# Patient Record
Sex: Female | Born: 1960 | Race: Black or African American | Hispanic: No | Smoking: Former smoker
Health system: Southern US, Community
[De-identification: ages and names within clinical notes are randomized; demographics above are authoritative.]

## PROBLEM LIST (undated history)

## (undated) DIAGNOSIS — E119 Type 2 diabetes mellitus without complications: Secondary | ICD-10-CM

## (undated) DIAGNOSIS — J45909 Unspecified asthma, uncomplicated: Secondary | ICD-10-CM

## (undated) DIAGNOSIS — E785 Hyperlipidemia, unspecified: Secondary | ICD-10-CM

## (undated) DIAGNOSIS — J449 Chronic obstructive pulmonary disease, unspecified: Secondary | ICD-10-CM

## (undated) DIAGNOSIS — M1990 Unspecified osteoarthritis, unspecified site: Secondary | ICD-10-CM

## (undated) DIAGNOSIS — G4730 Sleep apnea, unspecified: Secondary | ICD-10-CM

## (undated) DIAGNOSIS — I10 Essential (primary) hypertension: Secondary | ICD-10-CM

## (undated) HISTORY — DX: Chronic obstructive pulmonary disease, unspecified: J44.9

## (undated) HISTORY — DX: Type 2 diabetes mellitus without complications: E11.9

## (undated) HISTORY — DX: Unspecified osteoarthritis, unspecified site: M19.90

## (undated) HISTORY — PX: ANKLE SURGERY: SHX546

## (undated) HISTORY — DX: Unspecified asthma, uncomplicated: J45.909

## (undated) HISTORY — DX: Essential (primary) hypertension: I10

## (undated) HISTORY — DX: Sleep apnea, unspecified: G47.30

## (undated) HISTORY — DX: Hyperlipidemia, unspecified: E78.5

---

## 1982-01-23 HISTORY — PX: TUBAL LIGATION: SHX77

## 2001-01-23 HISTORY — PX: CHOLECYSTECTOMY: SHX55

## 2013-01-29 DIAGNOSIS — IMO0001 Reserved for inherently not codable concepts without codable children: Secondary | ICD-10-CM | POA: Diagnosis not present

## 2013-01-29 DIAGNOSIS — K2270 Barrett's esophagus without dysplasia: Secondary | ICD-10-CM | POA: Diagnosis not present

## 2013-01-29 DIAGNOSIS — H547 Unspecified visual loss: Secondary | ICD-10-CM | POA: Diagnosis not present

## 2013-02-13 DIAGNOSIS — L600 Ingrowing nail: Secondary | ICD-10-CM | POA: Diagnosis not present

## 2013-02-13 DIAGNOSIS — L609 Nail disorder, unspecified: Secondary | ICD-10-CM | POA: Diagnosis not present

## 2013-02-13 DIAGNOSIS — E119 Type 2 diabetes mellitus without complications: Secondary | ICD-10-CM | POA: Diagnosis not present

## 2013-04-01 DIAGNOSIS — I10 Essential (primary) hypertension: Secondary | ICD-10-CM | POA: Diagnosis not present

## 2013-04-01 DIAGNOSIS — IMO0001 Reserved for inherently not codable concepts without codable children: Secondary | ICD-10-CM | POA: Diagnosis not present

## 2013-04-01 DIAGNOSIS — Z1231 Encounter for screening mammogram for malignant neoplasm of breast: Secondary | ICD-10-CM | POA: Diagnosis not present

## 2013-04-01 DIAGNOSIS — F4320 Adjustment disorder, unspecified: Secondary | ICD-10-CM | POA: Diagnosis not present

## 2013-04-01 DIAGNOSIS — J4489 Other specified chronic obstructive pulmonary disease: Secondary | ICD-10-CM | POA: Diagnosis not present

## 2013-04-01 DIAGNOSIS — J449 Chronic obstructive pulmonary disease, unspecified: Secondary | ICD-10-CM | POA: Diagnosis not present

## 2013-04-08 DIAGNOSIS — E119 Type 2 diabetes mellitus without complications: Secondary | ICD-10-CM | POA: Diagnosis not present

## 2013-04-10 DIAGNOSIS — N63 Unspecified lump in unspecified breast: Secondary | ICD-10-CM | POA: Diagnosis not present

## 2013-04-10 DIAGNOSIS — Z1231 Encounter for screening mammogram for malignant neoplasm of breast: Secondary | ICD-10-CM | POA: Diagnosis not present

## 2013-04-15 DIAGNOSIS — S59909A Unspecified injury of unspecified elbow, initial encounter: Secondary | ICD-10-CM | POA: Diagnosis not present

## 2013-04-15 DIAGNOSIS — M79609 Pain in unspecified limb: Secondary | ICD-10-CM | POA: Diagnosis not present

## 2013-04-15 DIAGNOSIS — M7989 Other specified soft tissue disorders: Secondary | ICD-10-CM | POA: Diagnosis not present

## 2013-04-15 DIAGNOSIS — S52509A Unspecified fracture of the lower end of unspecified radius, initial encounter for closed fracture: Secondary | ICD-10-CM | POA: Diagnosis not present

## 2013-04-15 DIAGNOSIS — J449 Chronic obstructive pulmonary disease, unspecified: Secondary | ICD-10-CM | POA: Diagnosis not present

## 2013-04-15 DIAGNOSIS — W108XXA Fall (on) (from) other stairs and steps, initial encounter: Secondary | ICD-10-CM | POA: Diagnosis not present

## 2013-04-15 DIAGNOSIS — J209 Acute bronchitis, unspecified: Secondary | ICD-10-CM | POA: Diagnosis not present

## 2013-04-15 DIAGNOSIS — J4489 Other specified chronic obstructive pulmonary disease: Secondary | ICD-10-CM | POA: Diagnosis not present

## 2013-04-16 DIAGNOSIS — S52539A Colles' fracture of unspecified radius, initial encounter for closed fracture: Secondary | ICD-10-CM | POA: Diagnosis not present

## 2013-04-29 DIAGNOSIS — S52539A Colles' fracture of unspecified radius, initial encounter for closed fracture: Secondary | ICD-10-CM | POA: Diagnosis not present

## 2013-05-22 DIAGNOSIS — L609 Nail disorder, unspecified: Secondary | ICD-10-CM | POA: Diagnosis not present

## 2013-05-22 DIAGNOSIS — L600 Ingrowing nail: Secondary | ICD-10-CM | POA: Diagnosis not present

## 2013-05-22 DIAGNOSIS — E119 Type 2 diabetes mellitus without complications: Secondary | ICD-10-CM | POA: Diagnosis not present

## 2013-05-29 DIAGNOSIS — S5290XD Unspecified fracture of unspecified forearm, subsequent encounter for closed fracture with routine healing: Secondary | ICD-10-CM | POA: Diagnosis not present

## 2013-07-10 DIAGNOSIS — R05 Cough: Secondary | ICD-10-CM | POA: Diagnosis not present

## 2013-07-10 DIAGNOSIS — I10 Essential (primary) hypertension: Secondary | ICD-10-CM | POA: Diagnosis not present

## 2013-07-10 DIAGNOSIS — Z8619 Personal history of other infectious and parasitic diseases: Secondary | ICD-10-CM | POA: Diagnosis not present

## 2013-07-10 DIAGNOSIS — E119 Type 2 diabetes mellitus without complications: Secondary | ICD-10-CM | POA: Diagnosis not present

## 2013-07-10 DIAGNOSIS — Z794 Long term (current) use of insulin: Secondary | ICD-10-CM | POA: Diagnosis not present

## 2013-07-10 DIAGNOSIS — R059 Cough, unspecified: Secondary | ICD-10-CM | POA: Diagnosis not present

## 2013-07-10 DIAGNOSIS — I8500 Esophageal varices without bleeding: Secondary | ICD-10-CM | POA: Diagnosis not present

## 2013-07-10 DIAGNOSIS — R0602 Shortness of breath: Secondary | ICD-10-CM | POA: Diagnosis not present

## 2013-07-10 DIAGNOSIS — R072 Precordial pain: Secondary | ICD-10-CM | POA: Diagnosis not present

## 2013-07-10 DIAGNOSIS — R079 Chest pain, unspecified: Secondary | ICD-10-CM | POA: Diagnosis not present

## 2013-07-10 DIAGNOSIS — R51 Headache: Secondary | ICD-10-CM | POA: Diagnosis not present

## 2013-07-10 DIAGNOSIS — J40 Bronchitis, not specified as acute or chronic: Secondary | ICD-10-CM | POA: Diagnosis not present

## 2013-07-10 DIAGNOSIS — R0789 Other chest pain: Secondary | ICD-10-CM | POA: Diagnosis not present

## 2013-09-04 DIAGNOSIS — B9789 Other viral agents as the cause of diseases classified elsewhere: Secondary | ICD-10-CM | POA: Diagnosis not present

## 2013-09-04 DIAGNOSIS — J4489 Other specified chronic obstructive pulmonary disease: Secondary | ICD-10-CM | POA: Diagnosis not present

## 2013-09-04 DIAGNOSIS — J449 Chronic obstructive pulmonary disease, unspecified: Secondary | ICD-10-CM | POA: Diagnosis not present

## 2013-09-04 DIAGNOSIS — Z87891 Personal history of nicotine dependence: Secondary | ICD-10-CM | POA: Diagnosis not present

## 2013-09-04 DIAGNOSIS — M25569 Pain in unspecified knee: Secondary | ICD-10-CM | POA: Diagnosis not present

## 2013-09-04 DIAGNOSIS — R059 Cough, unspecified: Secondary | ICD-10-CM | POA: Diagnosis not present

## 2013-09-04 DIAGNOSIS — R05 Cough: Secondary | ICD-10-CM | POA: Diagnosis not present

## 2013-09-04 DIAGNOSIS — J029 Acute pharyngitis, unspecified: Secondary | ICD-10-CM | POA: Diagnosis not present

## 2013-10-27 DIAGNOSIS — R079 Chest pain, unspecified: Secondary | ICD-10-CM | POA: Diagnosis not present

## 2013-10-27 DIAGNOSIS — M5489 Other dorsalgia: Secondary | ICD-10-CM | POA: Diagnosis not present

## 2013-10-27 DIAGNOSIS — M549 Dorsalgia, unspecified: Secondary | ICD-10-CM | POA: Diagnosis not present

## 2013-10-27 DIAGNOSIS — K429 Umbilical hernia without obstruction or gangrene: Secondary | ICD-10-CM | POA: Diagnosis not present

## 2013-10-27 DIAGNOSIS — Z8619 Personal history of other infectious and parasitic diseases: Secondary | ICD-10-CM | POA: Diagnosis not present

## 2013-10-27 DIAGNOSIS — R0789 Other chest pain: Secondary | ICD-10-CM | POA: Diagnosis not present

## 2013-10-27 DIAGNOSIS — R109 Unspecified abdominal pain: Secondary | ICD-10-CM | POA: Diagnosis not present

## 2013-10-27 DIAGNOSIS — K420 Umbilical hernia with obstruction, without gangrene: Secondary | ICD-10-CM | POA: Diagnosis not present

## 2013-10-27 DIAGNOSIS — R112 Nausea with vomiting, unspecified: Secondary | ICD-10-CM | POA: Diagnosis not present

## 2013-11-06 DIAGNOSIS — Z23 Encounter for immunization: Secondary | ICD-10-CM | POA: Diagnosis not present

## 2013-12-05 ENCOUNTER — Ambulatory Visit (INDEPENDENT_AMBULATORY_CARE_PROVIDER_SITE_OTHER): Payer: Medicare Other | Admitting: Family Medicine

## 2013-12-05 ENCOUNTER — Encounter: Payer: Self-pay | Admitting: Family Medicine

## 2013-12-05 ENCOUNTER — Emergency Department (HOSPITAL_COMMUNITY)
Admission: EM | Admit: 2013-12-05 | Discharge: 2013-12-05 | Disposition: A | Discharge status: Home / Self Care | Payer: Medicare Other | Attending: Emergency Medicine | Admitting: Emergency Medicine

## 2013-12-05 ENCOUNTER — Encounter (HOSPITAL_COMMUNITY): Payer: Self-pay | Admitting: Emergency Medicine

## 2013-12-05 ENCOUNTER — Other Ambulatory Visit: Payer: Self-pay

## 2013-12-05 ENCOUNTER — Emergency Department (HOSPITAL_COMMUNITY): Payer: Medicare Other

## 2013-12-05 VITALS — BP 179/94 | HR 97 | Temp 98.1°F | Ht 67.5 in | Wt 309.2 lb

## 2013-12-05 DIAGNOSIS — R109 Unspecified abdominal pain: Secondary | ICD-10-CM | POA: Diagnosis present

## 2013-12-05 DIAGNOSIS — R7989 Other specified abnormal findings of blood chemistry: Secondary | ICD-10-CM | POA: Diagnosis not present

## 2013-12-05 DIAGNOSIS — M546 Pain in thoracic spine: Secondary | ICD-10-CM | POA: Diagnosis not present

## 2013-12-05 DIAGNOSIS — Z794 Long term (current) use of insulin: Secondary | ICD-10-CM | POA: Insufficient documentation

## 2013-12-05 DIAGNOSIS — E1169 Type 2 diabetes mellitus with other specified complication: Secondary | ICD-10-CM | POA: Insufficient documentation

## 2013-12-05 DIAGNOSIS — R51 Headache: Secondary | ICD-10-CM | POA: Insufficient documentation

## 2013-12-05 DIAGNOSIS — Z9851 Tubal ligation status: Secondary | ICD-10-CM | POA: Insufficient documentation

## 2013-12-05 DIAGNOSIS — Z7189 Other specified counseling: Secondary | ICD-10-CM

## 2013-12-05 DIAGNOSIS — Z87891 Personal history of nicotine dependence: Secondary | ICD-10-CM | POA: Insufficient documentation

## 2013-12-05 DIAGNOSIS — K439 Ventral hernia without obstruction or gangrene: Secondary | ICD-10-CM

## 2013-12-05 DIAGNOSIS — I10 Essential (primary) hypertension: Secondary | ICD-10-CM | POA: Insufficient documentation

## 2013-12-05 DIAGNOSIS — Z8669 Personal history of other diseases of the nervous system and sense organs: Secondary | ICD-10-CM | POA: Insufficient documentation

## 2013-12-05 DIAGNOSIS — R1033 Periumbilical pain: Secondary | ICD-10-CM | POA: Diagnosis not present

## 2013-12-05 DIAGNOSIS — Z79891 Long term (current) use of opiate analgesic: Secondary | ICD-10-CM | POA: Insufficient documentation

## 2013-12-05 DIAGNOSIS — R945 Abnormal results of liver function studies: Secondary | ICD-10-CM

## 2013-12-05 DIAGNOSIS — R1084 Generalized abdominal pain: Secondary | ICD-10-CM | POA: Diagnosis not present

## 2013-12-05 DIAGNOSIS — Z3202 Encounter for pregnancy test, result negative: Secondary | ICD-10-CM | POA: Diagnosis not present

## 2013-12-05 DIAGNOSIS — R6883 Chills (without fever): Secondary | ICD-10-CM | POA: Insufficient documentation

## 2013-12-05 DIAGNOSIS — R1013 Epigastric pain: Secondary | ICD-10-CM | POA: Diagnosis not present

## 2013-12-05 DIAGNOSIS — R110 Nausea: Secondary | ICD-10-CM | POA: Diagnosis not present

## 2013-12-05 DIAGNOSIS — R079 Chest pain, unspecified: Secondary | ICD-10-CM | POA: Diagnosis not present

## 2013-12-05 DIAGNOSIS — E119 Type 2 diabetes mellitus without complications: Secondary | ICD-10-CM | POA: Insufficient documentation

## 2013-12-05 DIAGNOSIS — H538 Other visual disturbances: Secondary | ICD-10-CM | POA: Insufficient documentation

## 2013-12-05 DIAGNOSIS — K429 Umbilical hernia without obstruction or gangrene: Secondary | ICD-10-CM | POA: Diagnosis not present

## 2013-12-05 DIAGNOSIS — R197 Diarrhea, unspecified: Secondary | ICD-10-CM | POA: Diagnosis not present

## 2013-12-05 DIAGNOSIS — J449 Chronic obstructive pulmonary disease, unspecified: Secondary | ICD-10-CM | POA: Diagnosis not present

## 2013-12-05 DIAGNOSIS — Z9049 Acquired absence of other specified parts of digestive tract: Secondary | ICD-10-CM | POA: Insufficient documentation

## 2013-12-05 DIAGNOSIS — R0602 Shortness of breath: Secondary | ICD-10-CM

## 2013-12-05 DIAGNOSIS — Z79899 Other long term (current) drug therapy: Secondary | ICD-10-CM | POA: Diagnosis not present

## 2013-12-05 DIAGNOSIS — R05 Cough: Secondary | ICD-10-CM | POA: Diagnosis not present

## 2013-12-05 DIAGNOSIS — E669 Obesity, unspecified: Secondary | ICD-10-CM

## 2013-12-05 DIAGNOSIS — R161 Splenomegaly, not elsewhere classified: Secondary | ICD-10-CM | POA: Diagnosis not present

## 2013-12-05 DIAGNOSIS — Z7689 Persons encountering health services in other specified circumstances: Secondary | ICD-10-CM

## 2013-12-05 LAB — CBC WITH DIFFERENTIAL/PLATELET
BASOS PCT: 0 % (ref 0–1)
Basophils Absolute: 0 10*3/uL (ref 0.0–0.1)
Eosinophils Absolute: 0 10*3/uL (ref 0.0–0.7)
Eosinophils Relative: 0 % (ref 0–5)
HCT: 38.9 % (ref 36.0–46.0)
HEMOGLOBIN: 12.9 g/dL (ref 12.0–15.0)
Lymphocytes Relative: 50 % — ABNORMAL HIGH (ref 12–46)
Lymphs Abs: 2.8 10*3/uL (ref 0.7–4.0)
MCH: 28.2 pg (ref 26.0–34.0)
MCHC: 33.2 g/dL (ref 30.0–36.0)
MCV: 85.1 fL (ref 78.0–100.0)
Monocytes Absolute: 0.5 10*3/uL (ref 0.1–1.0)
Monocytes Relative: 9 % (ref 3–12)
NEUTROS ABS: 2.3 10*3/uL (ref 1.7–7.7)
NEUTROS PCT: 41 % — AB (ref 43–77)
Platelets: 162 10*3/uL (ref 150–400)
RBC: 4.57 MIL/uL (ref 3.87–5.11)
RDW: 14.3 % (ref 11.5–15.5)
WBC: 5.7 10*3/uL (ref 4.0–10.5)

## 2013-12-05 LAB — COMPREHENSIVE METABOLIC PANEL
ALBUMIN: 3.4 g/dL — AB (ref 3.5–5.2)
ALK PHOS: 92 U/L (ref 39–117)
ALT: 59 U/L — AB (ref 0–35)
AST: 74 U/L — AB (ref 0–37)
Anion gap: 16 — ABNORMAL HIGH (ref 5–15)
BILIRUBIN TOTAL: 1.1 mg/dL (ref 0.3–1.2)
BUN: 9 mg/dL (ref 6–23)
CHLORIDE: 99 meq/L (ref 96–112)
CO2: 21 mEq/L (ref 19–32)
Calcium: 9.1 mg/dL (ref 8.4–10.5)
Creatinine, Ser: 0.6 mg/dL (ref 0.50–1.10)
GFR calc Af Amer: >90 mL/min (ref >90)
GFR calc non Af Amer: >90 mL/min (ref >90)
Glucose, Bld: 107 mg/dL — ABNORMAL HIGH (ref 70–99)
POTASSIUM: 3.5 meq/L — AB (ref 3.7–5.3)
SODIUM: 136 meq/L — AB (ref 137–147)
Total Protein: 8.5 g/dL — ABNORMAL HIGH (ref 6.0–8.3)

## 2013-12-05 LAB — URINALYSIS, ROUTINE W REFLEX MICROSCOPIC
GLUCOSE, UA: NEGATIVE mg/dL
Hgb urine dipstick: NEGATIVE
Ketones, ur: 15 mg/dL — AB
Nitrite: NEGATIVE
PH: 8 (ref 5.0–8.0)
Protein, ur: NEGATIVE mg/dL
SPECIFIC GRAVITY, URINE: 1.026 (ref 1.005–1.030)
Urobilinogen, UA: 1 mg/dL (ref 0.0–1.0)

## 2013-12-05 LAB — URINE MICROSCOPIC-ADD ON

## 2013-12-05 LAB — PRO B NATRIURETIC PEPTIDE: Pro B Natriuretic peptide (BNP): 8.9 pg/mL (ref 0–125)

## 2013-12-05 LAB — CBG MONITORING, ED
GLUCOSE-CAPILLARY: 110 mg/dL — AB (ref 70–99)
GLUCOSE-CAPILLARY: 107 mg/dL — AB (ref 70–99)
GLUCOSE-CAPILLARY: 125 mg/dL — AB (ref 70–99)

## 2013-12-05 LAB — I-STAT TROPONIN, ED: Troponin i, poc: 0 ng/mL (ref 0.00–0.08)

## 2013-12-05 LAB — I-STAT CG4 LACTIC ACID, ED: LACTIC ACID, VENOUS: 2.06 mmol/L (ref 0.5–2.2)

## 2013-12-05 LAB — POC URINE PREG, ED: Preg Test, Ur: NEGATIVE

## 2013-12-05 MED ORDER — SODIUM CHLORIDE 0.9 % IV SOLN
INTRAVENOUS | Status: DC
  Administered 2013-12-05: 21:00:00 via INTRAVENOUS

## 2013-12-05 MED ORDER — OXYCODONE HCL 5 MG PO TABS: 2.5000 mg | ORAL_TABLET | Freq: Four times a day (QID) | ORAL | Status: DC | PRN

## 2013-12-05 MED ORDER — HYDROMORPHONE HCL 1 MG/ML IJ SOLN
1.0000 mg | Freq: Once | INTRAMUSCULAR | Status: AC
  Administered 2013-12-05: 1 mg via INTRAVENOUS
  Filled 2013-12-05: qty 1

## 2013-12-05 MED ORDER — ONDANSETRON HCL 4 MG/2ML IJ SOLN
4.0000 mg | Freq: Once | INTRAMUSCULAR | Status: AC
  Administered 2013-12-05: 4 mg via INTRAVENOUS
  Filled 2013-12-05: qty 2

## 2013-12-05 MED ORDER — IOHEXOL 300 MG/ML  SOLN
100.0000 mL | Freq: Once | INTRAMUSCULAR | Status: AC | PRN
  Administered 2013-12-05: 100 mL via INTRAVENOUS

## 2013-12-05 MED ORDER — IOHEXOL 300 MG/ML  SOLN
25.0000 mL | Freq: Once | INTRAMUSCULAR | Status: AC | PRN
  Administered 2013-12-05: 25 mL via ORAL

## 2013-12-05 MED ORDER — SODIUM CHLORIDE 0.9 % IV BOLUS (SEPSIS)
1000.0000 mL | Freq: Once | INTRAVENOUS | Status: AC
  Administered 2013-12-05: 1000 mL via INTRAVENOUS

## 2013-12-05 MED ORDER — MORPHINE SULFATE 4 MG/ML IJ SOLN
4.0000 mg | Freq: Once | INTRAMUSCULAR | Status: AC
  Administered 2013-12-05: 4 mg via INTRAVENOUS
  Filled 2013-12-05: qty 1

## 2013-12-05 NOTE — Progress Notes (Signed)
Patient ID: Kristina Hendricks, female   DOB: 05-27-1960, 53 y.o.   MRN: [id number]    Subjective: CC:SOB and hernia causing pain HPI: Patient is a 53 y.o. female presenting to clinic today to establish as a new patient. Concerns today include:  Patient is a new patient of Dr Macario Golds that is presenting to the office with multiple concerns.   1. SOB Started on Monday.  Started coughing, productive (clear, sticky, thick and without blood).  Patient started getting hoarse and sinuses were draining.  Patient states she has a sharp pain in the middle of her chest when she takes a deep breath.  She admits to SOB when she was sitting and walking.  She admits that SOB was initially intermittent but has is lasting longer.  She tried using albuterol nebulizer treatment and HFA with no relief.  Denies fevers, chills, vomiting, swelling in ankles.  Admits to nausea throughout the week and dizziness.  FBG: 121 (84-92).  Denies lows.  Admits to worsened SOB when lying flat on back.  Uses 3 pillows at night.  This has been ongoing.    2. Abdominal pain/hernia Patient reports to me that she has a known hernia.  She was supposed to see a surgeon about repair at the beginning of this year but had difficulty with her providers.  She is here today to report that hernia used to never be painful until maybe the last month or so.  She reports that pain is all over her belly and that it is worsened with cough.  Pain is always present.  She does not notice that it is worse with eating.  She admits to nausea occasionally but denies vomiting.  Denies diarrhea, constipation or bloody stool.  Stools are regular.  She states that she had a colonoscopy last year and was advised to repeat 11/2013 as there was evidence of polyps.  History Reviewed: former smoker. Health Maintenance: colonoscopy due  ROS: All other systems reviewed and are negative.  Objective: Office vital signs reviewed. BP 179/94 mmHg  Pulse 97  Temp(Src)  98.1 F (36.7 C) (Oral)  Ht 5' 7.5" (1.715 m)  Wt 309 lb 3.2 oz (140.252 kg)  BMI 47.68 kg/m2 O2: 99% RA  Physical Examination:  General: Awake, alert, morbidly obese female who looks anxious and is breathing heavily HEENT: Atraumatic, normocephalic, EOMI, poor dentition (several upper teeth missing) Cardio: Distant heart sounds, RRR, S1S2 heard, no murmurs appreciated Pulm: globally decreased breath sounds, no wheezes, rhonchi or rales, +dyspnea, patient is NOT able to complete sentences without taking a breath.  Dyspneic with ambulation. Abdomen: obese, non distended, diffusely TTP in all quadrants, +guarding, palpable mass (suspect hernia) superior to the umbilicus about 2cm x2 cm. +BS x4 Extremities: WWP, No edema, cyanosis or clubbing; +1 pulses bilaterally  Spirometry: 200   Assessment: 53 y.o. female with diffuse abdominal pain and SOB.  Plan: See Problem List and After Visit Summary  **>60 minutes of face to face time was spent in coordinating care for this patient.  Janora Norlander, DO PGY-1, [place]

## 2013-12-05 NOTE — ED Notes (Addendum)
Pt reports sob and abdominal pain since Monday. This afternoon she started shaking and having blurred vision. Pt has not eaten since last night d/t having blood work done today. cbg 110 upon arrival. Pt reports possibly some diarrhea. Sent here from doctors office for further evaluation of abdominal pain.

## 2013-12-05 NOTE — Discharge Instructions (Signed)
Do not use any mediations containing (acetaminophen)  Take oxycodone for breakthrough pain, do not drink alcohol, drive, care for children or do other critical tasks while taking oxycodone.  Please follow with your primary care doctor in the next 2 days for a check-up. They must obtain records for further management.   Do not hesitate to return to the Emergency Department for any new, worsening or concerning symptoms.

## 2013-12-05 NOTE — ED Notes (Signed)
Pt aware of urine sample needed, unable to use the bathroom at this time.

## 2013-12-05 NOTE — ED Provider Notes (Signed)
CSN: [id number]     Arrival date & time 12/05/13  1517 History   First MD Initiated Contact with Patient 12/05/13 1644     Chief Complaint  Patient presents with  . Abdominal Pain  . Shortness of Breath     (Consider location/radiation/quality/duration/timing/severity/associated sxs/prior Treatment) HPI  Kristina Hendricks is a 53 y.o. female sent from primary care for evaluation of abdominal pain and shortness of breath. Patient states she has midline hernia, been giving her pain over the last month but significantly worsening over the last several days. She had watery stool this morning. She denies emesis, fever. Endorses chills and 2 spisodes of watery diarrhea  this morning. Patient has anterior chest pain which is exacerbated by cough, states she's coughing up clear to white phlegm, states that she thinks this is from postnasal drip from her rhinorrhea. She also endorses 8 out of 10 frontal and crown headache described as aching exacerbated by cough. She also reports blurred vision in both eyes starting this a.m. She also reports pain in between shoulder blades which she's had for 6 months but states is worsening today. She is compliant with her home medications. She denies dysarthria, ataxia, cervicalgia.     Past Medical History  Diagnosis Date  . Arthritis   . Asthma   . COPD (chronic obstructive pulmonary disease)   . Diabetes mellitus without complication   . Hypertension   . Sleep apnea    Past Surgical History  Procedure Laterality Date  . Cholecystectomy  2003  . Tubal ligation  1984   Family History  Problem Relation Age of Onset  . Alcohol abuse Mother   . Drug abuse Mother   . Cancer Mother   . Depression Mother   . Diabetes Mother   . Hypertension Mother   . Heart disease Father   . Depression Father   . Diabetes Father   . Hypertension Father    History  Substance Use Topics  . Smoking status: Former Smoker    Quit date: 10/24/2011  . Smokeless tobacco:  Not on file  . Alcohol Use: Not on file   OB History    No data available     Review of Systems   10 systems reviewed and found to be negative, except as noted in the HPI.   Allergies  Review of patient's allergies indicates no known allergies.  Home Medications   Prior to Admission medications   Medication Sig Start Date End Date Taking? Authorizing Provider  albuterol (PROVENTIL HFA;VENTOLIN HFA) 108 (90 BASE) MCG/ACT inhaler Inhale 2 puffs into the lungs every 6 (six) hours as needed for wheezing or shortness of breath.   Yes Historical Provider, MD  diltiazem (TIAZAC) 240 MG 24 hr capsule Take 240 mg by mouth daily.   Yes Historical Provider, MD  glipiZIDE (GLUCOTROL) 5 MG tablet Take 2.5 mg by mouth 2 (two) times daily.   Yes Historical Provider, MD  insulin glargine (LANTUS) 100 UNIT/ML injection Inject 30 Units into the skin at bedtime.   Yes Historical Provider, MD  sitaGLIPtin (JANUVIA) 50 MG tablet Take 50 mg by mouth daily.   Yes Historical Provider, MD  telmisartan-hydrochlorothiazide (MICARDIS HCT) 80-25 MG per tablet Take 1 tablet by mouth daily.   Yes Historical Provider, MD  oxyCODONE (ROXICODONE) 5 MG immediate release tablet Take 0.5-1 tablets (2.5-5 mg total) by mouth every 6 (six) hours as needed. 12/05/13   Kyaire Gruenewald, PA-C   BP 132/75 mmHg  Pulse 94  Temp(Src) 97.6 F (36.4 C)  Resp 18  SpO2 94%  LMP 11/20/2013 (Approximate) Physical Exam  Constitutional: She is oriented to person, place, and time. She appears well-developed and well-nourished. No distress.  Obese   HENT:  Head: Normocephalic and atraumatic.  Mouth/Throat: Oropharynx is clear and moist.  Eyes: Conjunctivae and EOM are normal. Pupils are equal, round, and reactive to light.  Neck: Normal range of motion.  Cardiovascular: Normal rate, regular rhythm and intact distal pulses.   Pulmonary/Chest: Effort normal and breath sounds normal. No stridor. No respiratory distress. She has no  wheezes. She has no rales. She exhibits tenderness.  She has diffuse aches was only tender to palpation along the anterior chest.  Abdominal: Soft. Bowel sounds are normal. She exhibits no distension and no mass. There is tenderness. There is no rebound and no guarding.  Tender to palpation in the epigastrium, no focal hernia identified.  Musculoskeletal: Normal range of motion.  Neurological: She is alert and oriented to person, place, and time.  Psychiatric: She has a normal mood and affect.  Nursing note and vitals reviewed.   ED Course  Procedures (including critical care time) Labs Review Labs Reviewed  CBC WITH DIFFERENTIAL - Abnormal; Notable for the following:    Neutrophils Relative % 41 (*)    Lymphocytes Relative 50 (*)    All other components within normal limits  COMPREHENSIVE METABOLIC PANEL - Abnormal; Notable for the following:    Sodium 136 (*)    Potassium 3.5 (*)    Glucose, Bld 107 (*)    Total Protein 8.5 (*)    Albumin 3.4 (*)    AST 74 (*)    ALT 59 (*)    Anion gap 16 (*)    All other components within normal limits  URINALYSIS, ROUTINE W REFLEX MICROSCOPIC - Abnormal; Notable for the following:    Bilirubin Urine SMALL (*)    Ketones, ur 15 (*)    Leukocytes, UA TRACE (*)    All other components within normal limits  URINE MICROSCOPIC-ADD ON - Abnormal; Notable for the following:    Squamous Epithelial / LPF FEW (*)    Bacteria, UA FEW (*)    All other components within normal limits  CBG MONITORING, ED - Abnormal; Notable for the following:    Glucose-Capillary 110 (*)    All other components within normal limits  CBG MONITORING, ED - Abnormal; Notable for the following:    Glucose-Capillary 107 (*)    All other components within normal limits  CBG MONITORING, ED - Abnormal; Notable for the following:    Glucose-Capillary 125 (*)    All other components within normal limits  PRO B NATRIURETIC PEPTIDE  I-STAT TROPOININ, ED  POC URINE PREG, ED   I-STAT CG4 LACTIC ACID, ED    Imaging Review Ct Abdomen Pelvis W Contrast  12/05/2013   CLINICAL DATA:  Shortness of breath, abdominal pain starting Monday  EXAM: CT ABDOMEN AND PELVIS WITH CONTRAST  TECHNIQUE: Multidetector CT imaging of the abdomen and pelvis was performed using the standard protocol following bolus administration of intravenous contrast.  CONTRAST:  168mL OMNIPAQUE IOHEXOL 300 MG/ML  SOLN  COMPARISON:  10/27/2013  FINDINGS: Lung bases are unremarkable. There are streaky artifacts from patient's large body habitus. The patient is status post cholecystectomy.  Again noted nodular liver contour consistent with cirrhosis. No intrahepatic biliary ductal dilatation. Splenomegaly again noted with spleen measuring 13 cm in length. Portal vein measures 1.4 cm in  diameter suspicious for portal hypertension.  Kidneys are symmetrical in size. No hydronephrosis or hydroureter. The pancreas and adrenal glands are unremarkable.  Again noted periumbilical ventral hernia containing omental fat measures about 12 cm without evidence of acute complication. There is no significant change from prior exam.  No small bowel obstruction.  No ascites or free air.  No adenopathy.  The uterus is unremarkable. There is a right ovarian cyst measures 3.6 cm. No inguinal adenopathy. No destructive bony lesions are noted within pelvis.  Sagittal images of the spine are unremarkable. Delayed renal images shows bilateral renal symmetrical excretion.  IMPRESSION: 1. Again noted nodular contour of the liver consistent with cirrhosis. Portal vein measures 1.4 cm in diameter suspicious for portal hypertension. Mild splenomegaly again noted. 2. Stable periumbilical and supraumbilical ventral hernia containing omental fat without evidence of acute complication. 3. No hydronephrosis or hydroureter. 4. There is a right ovarian cyst measures 3.6 cm.   Electronically Signed   By: Lahoma Crocker M.D.   On: 12/05/2013 22:26   Dg Abd  Acute W/chest  12/05/2013   CLINICAL DATA:  Abdominal pain lower umbilical region for 5 days, initial evaluation, nausea cough shortness of breath, personal history of hypertension and diabetes  EXAM: ACUTE ABDOMEN SERIES (ABDOMEN 2 VIEW & CHEST 1 VIEW)  COMPARISON:  10/27/2013  FINDINGS: Heart size and vascular pattern are normal. Lungs are clear. No free air in the abdomen.  Right upper quadrant clips consistent with cholecystectomy. There are no abnormally dilated loops of bowel. There are no abnormal opacities.  IMPRESSION: Negative abdominal radiographs.  No acute cardiopulmonary disease.   Electronically Signed   By: Skipper Cliche M.D.   On: 12/05/2013 18:37     EKG Interpretation None      MDM   Final diagnoses:  Ventral hernia without obstruction or gangrene  Elevated LFTs    Filed Vitals:   12/05/13 2030 12/05/13 2121 12/05/13 2130 12/05/13 2230  BP: 159/124 125/102 126/71 132/75  Pulse: 87 91 82 94  Temp:      Resp: 20 18    SpO2: 100% 100% 97% 94%    Medications  0.9 %  sodium chloride infusion ( Intravenous Stopped 12/05/13 2304)  ondansetron (ZOFRAN) injection 4 mg (4 mg Intravenous Given 12/05/13 1738)  sodium chloride 0.9 % bolus 1,000 mL (0 mLs Intravenous Stopped 12/05/13 2056)  HYDROmorphone (DILAUDID) injection 1 mg (1 mg Intravenous Given 12/05/13 1739)  morphine 4 MG/ML injection 4 mg (4 mg Intravenous Given 12/05/13 1957)  ondansetron (ZOFRAN) injection 4 mg (4 mg Intravenous Given 12/05/13 1957)  iohexol (OMNIPAQUE) 300 MG/ML solution 25 mL (25 mLs Oral Contrast Given 12/05/13 1935)  iohexol (OMNIPAQUE) 300 MG/ML solution 100 mL (100 mLs Intravenous Contrast Given 12/05/13 2152)    Kristina Hendricks is a 53 y.o. female presenting with multiple complaints, mostly concerned about her abdominal pain and hernia today. Patient had several episodes of diarrhea, she is passing flatus. She denies fever, chills, nausea, vomiting. She is diffusely tender to palpation  along the epigastrium with no guarding or rebound.  LFTs are mildly elevated.abdominal series with no significant findings.  CT abdomen pelvis shows nodular liver consistent with cirrhosis. There is a periumbilical hernia with omental fat without complication.  Serial abdominal exams remained nonsurgical. Pain is improved in the ED. I have discussed the findings with the patient and advised her to follow-up with surgery for the hernia, advised to DC acetaminophen and let both primary care and gastroenterology manage  the cirrhosis. Patient states she was diagnosed with hep C in 1999, she's not received any treatment.  This is a shared visit with the attending physician who personally evaluated the patient and agrees with the care plan.   Evaluation does not show pathology that would require ongoing emergent intervention or inpatient treatment. Pt is hemodynamically stable and mentating appropriately. Discussed findings and plan with patient/guardian, who agrees with care plan. All questions answered. Return precautions discussed and outpatient follow up given.   Discharge Medication List as of 12/05/2013 10:56 PM    START taking these medications   Details  oxyCODONE (ROXICODONE) 5 MG immediate release tablet Take 0.5-1 tablets (2.5-5 mg total) by mouth every 6 (six) hours as needed., Starting 12/05/2013, Until Discontinued, [employer], PA-C 12/05/13 [place], MD 12/05/13 2344

## 2013-12-06 DIAGNOSIS — R0600 Dyspnea, unspecified: Secondary | ICD-10-CM | POA: Insufficient documentation

## 2013-12-06 DIAGNOSIS — R109 Unspecified abdominal pain: Secondary | ICD-10-CM | POA: Insufficient documentation

## 2013-12-06 NOTE — Assessment & Plan Note (Signed)
BP 179/94.  Patient did NOT take medications this morning.  Patient without headaches, dizziness.  Patient with CP with deep inhalation and SOB on exam today. -Patient transported to ED for evaluation. -Recommend follow up on BP with medication adjustment if appropriate.

## 2013-12-06 NOTE — Assessment & Plan Note (Addendum)
Patient reports that she has COPD.  She is dyspneic on exam.  No wheezing but globally decreased BS.  She reports that she has not taken any of her medication this morning -1 Duoneb administered in office with mild improvement of symptoms -Spirometry Actual 200 L/min; Expected 472 L/min.  -Patient should continue to take Advair daily as directed and use Ventolin PRN SOB -Patient is being transported to ED for SOB and concurrent diffuse abdominal pain as I am uncomfortable with patient's stability going home. -Recommend follow up with Dr Valentina Lucks for PFTs at a later date.

## 2013-12-06 NOTE — Assessment & Plan Note (Signed)
On exam, patient's abdominal is diffusely TTP with a palpable mass superior to the umbilicus.  Patient reports normal BM with no blood but an abnormal colonoscopy and was recommended for f/u in 1 yr.  Strangulated hernia vs peritonitis vs partial obstruction vs ischemia. -Patient transported to ED for further evaluation and management -Recommend f/u by PCP.

## 2013-12-07 NOTE — Progress Notes (Signed)
Discussed with resident.

## 2013-12-08 ENCOUNTER — Ambulatory Visit (INDEPENDENT_AMBULATORY_CARE_PROVIDER_SITE_OTHER): Payer: Medicare Other | Admitting: Family Medicine

## 2013-12-08 ENCOUNTER — Encounter: Payer: Self-pay | Admitting: Family Medicine

## 2013-12-08 VITALS — BP 113/86 | HR 75 | Temp 98.3°F | Ht 67.5 in | Wt 311.4 lb

## 2013-12-08 DIAGNOSIS — E669 Obesity, unspecified: Secondary | ICD-10-CM | POA: Diagnosis not present

## 2013-12-08 DIAGNOSIS — E1169 Type 2 diabetes mellitus with other specified complication: Secondary | ICD-10-CM

## 2013-12-08 DIAGNOSIS — K439 Ventral hernia without obstruction or gangrene: Secondary | ICD-10-CM | POA: Diagnosis not present

## 2013-12-08 DIAGNOSIS — E119 Type 2 diabetes mellitus without complications: Secondary | ICD-10-CM | POA: Diagnosis not present

## 2013-12-08 DIAGNOSIS — B182 Chronic viral hepatitis C: Secondary | ICD-10-CM | POA: Insufficient documentation

## 2013-12-08 DIAGNOSIS — I10 Essential (primary) hypertension: Secondary | ICD-10-CM | POA: Diagnosis not present

## 2013-12-08 DIAGNOSIS — R933 Abnormal findings on diagnostic imaging of other parts of digestive tract: Secondary | ICD-10-CM | POA: Diagnosis not present

## 2013-12-08 MED ORDER — DILTIAZEM HCL ER BEADS 240 MG PO CP24: 240.0000 mg | ORAL_CAPSULE | Freq: Every day | ORAL | Status: DC

## 2013-12-08 MED ORDER — IBUPROFEN 600 MG PO TABS: 600.0000 mg | ORAL_TABLET | Freq: Four times a day (QID) | ORAL | Status: DC | PRN

## 2013-12-08 MED ORDER — TELMISARTAN-HCTZ 80-25 MG PO TABS: 1.0000 | ORAL_TABLET | Freq: Every day | ORAL | Status: DC

## 2013-12-08 NOTE — Patient Instructions (Signed)
You need to make an appointment to see Dr. Gwendlyn Deutscher, your new primary care doctor here at the [facility]. Make this as you leave today.   Also, sign the release of information paperwork so we can have your records transferred here from your previous doctors.  I have filled your blood pressure medicines for 1 month, make sure to get more refills from Dr. Gwendlyn Deutscher.   You will be contacted for appointment with the general surgeon for your hernia and weight loss surgery and from the gastroenterologist (GI doctor) for your colonoscopy and hepatitis C.

## 2013-12-08 NOTE — Progress Notes (Signed)
Patient ID: Kristina Hendricks, female   DOB: 09/19/1960, 53 y.o.   MRN: [id number]   Subjective:  Kristina Hendricks is a 53 y.o. female here for hospital follow up.  Kristina Hendricks was sent to the emergency room on 11/13 from the [facility] (where she was establishing care) for concerns about dyspnea and abdominal pain. Her dyspnea largely resolved and work up of abdominal pain yielded radiographic evidence of hepatic nodularity and cirrhosis as well as a large ventral hernia. Physical exam was reassuring against incarceration or need for further treatment so she was discharged and told to follow up today.   She presents today with multiple complaints, principle of which is a non productive cough that developed about 3 days ago. She denies fevers, rhinorrhea, congestion, ear pain/drainage, sore throat, myalgias and rashes. She also complains of chronic abdominal pain overlying her ventral hernia. She was taking tylenol for this, but was told not to continue this due to her liver disease. She has had no changes in bowel or bladder habits, no blood in her stool and no nausea or vomiting.   Kristina Hendricks is establishing care here, transferring from her long-time doctors in [facility], nearer to her home, because she "wasn't getting any better." She reports a diagnosis of hepatitis C in 1999 and that though she saw a specialist (she doesn't recall the type of specialist) in [place] for her liver in the past, they never treated her with anything. She is interested in addressing this issue now.   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 She carried these habits until 2001 when she quit with help of rehab program. She has been clean since that time without recovery group meetings, though she is treated for bipolar disorder by behavioral health.   She's had  polyps (she doesn't know the type or exact number) removed last year on colonoscopy and reports that she was told she needs a repeat colonoscopy in 1 year (approximately now).   Stopped smoking 3 years ago Nov 01, 2010 after smoking cigarettes for approximately 20 years off and on. Her best estimate of total exposure is about 15 pack years or so.   All other pertinent systems reviewed and are negative. Objective:  BP 113/86 mmHg  Pulse 75  Temp(Src) 98.3 F (36.8 C) (Oral)  Ht 5' 7.5" (1.715 m)  Wt 311 lb 6.4 oz (141.25 kg)  BMI 48.02 kg/m2  LMP 11/20/2013 (Approximate)  Gen: Obese 53 y.o. female in no distress HEENT: MMM, EOMI, PERRL, mildly icteric sclerae CV: Rate is regular, heart sounds difficult to appreciate, possible faint systolic murmur at apex, no JVD Resp: Non-labored, CTAB, no wheezes noted Abd: Soft, obese, ND, BS present, large midline hernia easily reducible with pain on palpation. No other tenderness. Organomegaly exceedingly difficult to evaluate due to habitus Neuro: Alert and oriented, speech normal  VENTRAL HERNIA WITHOUT OBSTRUCTION Dx chepatitis C in 1999: ELEVATED LFTs and radiographic evidence of nodular cirrhosis s/p cholecystectomy  1. Again noted nodular contour of the liver consistent with cirrhosis. Portal vein measures 1.4 cm in diameter suspicious for portal hypertension. Mild splenomegaly again noted. 2. Stable periumbilical and supraumbilical ventral hernia containing omental fat without evidence of acute complication. 3. No hydronephrosis or hydroureter. 4. There is a right ovarian cyst measures 3.6 cm. Assessment & Plan:  Kristina Hendricks is a 53  y.o. female with:  Problem List Items Addressed This Visit    None

## 2013-12-09 ENCOUNTER — Telehealth: Payer: Self-pay | Admitting: Family Medicine

## 2013-12-09 DIAGNOSIS — I10 Essential (primary) hypertension: Secondary | ICD-10-CM

## 2013-12-09 MED ORDER — IBUPROFEN 600 MG PO TABS: 600.0000 mg | ORAL_TABLET | Freq: Four times a day (QID) | ORAL | Status: DC | PRN

## 2013-12-09 MED ORDER — TELMISARTAN-HCTZ 80-25 MG PO TABS: 1.0000 | ORAL_TABLET | Freq: Every day | ORAL | Status: DC

## 2013-12-09 MED ORDER — DILTIAZEM HCL ER BEADS 240 MG PO CP24: 240.0000 mg | ORAL_CAPSULE | Freq: Every day | ORAL | Status: DC

## 2013-12-09 NOTE — Assessment & Plan Note (Addendum)
No strangulation/incarceration. Unsure of cause of pain related to this given reassuring extensive work up in ED 11/13. No acute intraabdominal process suspected based on physical exam today. Will continue to monitor and recommend NSAIDs only prn pain.   - Refer to general surgery to counsel on options other than abdominal wall strengthening.

## 2013-12-09 NOTE — Telephone Encounter (Signed)
Pt called because she said that [facility] said that they didn't receive a prescription request. jw

## 2013-12-09 NOTE — Telephone Encounter (Signed)
LM for patient to call back.  There are 2 locations for [facility] on [address] in [place].  I asked her to call back and clarify which one.  Thanks [employer]

## 2013-12-09 NOTE — Assessment & Plan Note (Addendum)
Per pt report, unknown number and type of polyps removed on prior (?screening) colonoscopy and recommended repeat colonoscopy around this time. No worrisome symptoms currently. - Sign Release of Information to obtain records from [facility] and Health [facility]. - Referral to gastroenterology for repeat colonoscopy.

## 2013-12-09 NOTE — Telephone Encounter (Signed)
Spoke with patient and verified her pharmacy.  She is aware that they were resent. Jazmin Hartsell,CMA

## 2013-12-09 NOTE — Assessment & Plan Note (Signed)
BP at goal on non-DHP CCB (likely for h/o AFib), ARB/HCTZ.  - Continue current therapy  - Recent CBC wnl and CMP with K 3.5 and preserved renal function Of note, no suggestion of LVH on ECG while in ED and Pro-BNP was completely normal, so no obvious stigmata of cardiac end-organ damage.

## 2013-12-09 NOTE — Assessment & Plan Note (Addendum)
BMI 48 with multiple sequelae: insulin resistance requiring insulin, GERD, dyspnea (suspect OHS/OSA in addition to deconditioning). She is interested in bariatric surgery.  - Discuss therapeutic lifestyle changes and urged her to consider goals to discuss at next OV. - Needs PFTs to differentiate obstructive lung disease (h/o tobacco) and OHS. Consider sleep study, though she reports she has had a negative study for this in the past and has never been tried on CPAP.  - Refer to general surgery to assess for candidacy for bariatric surgery. - Needs appropriate screening labs at PCP follow up.

## 2013-12-09 NOTE — Assessment & Plan Note (Signed)
Chronic hepatitis C without any history of treatment in the past per pt. Will obtain records from [facility] and Health Department. - Refer to gastroenterology for hepatitis C and repeat colonoscopy. She could also be evaluated by ID but I also suspect possible contribution of NAFLD to hepatic impairment - Stop tylenol and all hepatotoxic medications  - Defer hepatitis labs to GI

## 2013-12-09 NOTE — Assessment & Plan Note (Signed)
Patient maintained on oral/insulin regimen:  - Januvia, glipizide BID, lantus 30u qHS - Follow up with PCP for evaluation and management.

## 2013-12-09 NOTE — Telephone Encounter (Signed)
Pt called back because she said her prescription where sent to [facility] but she doesn't use [facility] she use [facility] on [address]. Please call them in to Olivia Lopez de Gutierrez. jw

## 2013-12-15 ENCOUNTER — Telehealth: Payer: Self-pay | Admitting: Family Medicine

## 2013-12-15 NOTE — Telephone Encounter (Signed)
Calling re: referrals for pt, pt is scheduled to see a doctor there on 12/23 for the ventral hernia but they cannot see pt for obesity until pt attends a bariatric seminar at [place].

## 2013-12-16 NOTE — Telephone Encounter (Signed)
Mailed pt info about bariatric surgery. Kristina Hendricks, Kristina Hendricks

## 2013-12-16 NOTE — Telephone Encounter (Signed)
Spoke with patient and made her aware of appt.  I also sent her the number for weight loss seminars at [place]. Vinita Prentiss,CMA

## 2013-12-23 ENCOUNTER — Telehealth: Payer: Self-pay | Admitting: Family Medicine

## 2013-12-23 NOTE — Telephone Encounter (Signed)
Noted. Kristina Hendricks Dawn  

## 2013-12-23 NOTE — Telephone Encounter (Signed)
Pt called because [facility] called her and said that at this time they are not taking any new patients. Can we send the referral to [facility]? Please let patient know. jw

## 2014-01-02 ENCOUNTER — Other Ambulatory Visit: Payer: Self-pay | Admitting: Family Medicine

## 2014-01-02 ENCOUNTER — Ambulatory Visit (INDEPENDENT_AMBULATORY_CARE_PROVIDER_SITE_OTHER): Payer: Medicare Other | Admitting: Family Medicine

## 2014-01-02 ENCOUNTER — Encounter: Payer: Self-pay | Admitting: Family Medicine

## 2014-01-02 VITALS — BP 134/76 | HR 88 | Temp 98.6°F | Ht 67.5 in | Wt 312.0 lb

## 2014-01-02 DIAGNOSIS — I10 Essential (primary) hypertension: Secondary | ICD-10-CM

## 2014-01-02 DIAGNOSIS — E119 Type 2 diabetes mellitus without complications: Secondary | ICD-10-CM | POA: Diagnosis not present

## 2014-01-02 DIAGNOSIS — E669 Obesity, unspecified: Secondary | ICD-10-CM

## 2014-01-02 DIAGNOSIS — Z0000 Encounter for general adult medical examination without abnormal findings: Secondary | ICD-10-CM | POA: Diagnosis not present

## 2014-01-02 DIAGNOSIS — E1169 Type 2 diabetes mellitus with other specified complication: Secondary | ICD-10-CM

## 2014-01-02 DIAGNOSIS — B182 Chronic viral hepatitis C: Secondary | ICD-10-CM | POA: Diagnosis not present

## 2014-01-02 LAB — LIPID PANEL
CHOL/HDL RATIO: 3.2 ratio
CHOLESTEROL: 114 mg/dL (ref 0–200)
HDL: 36 mg/dL — ABNORMAL LOW (ref >39)
LDL Cholesterol: 62 mg/dL (ref 0–99)
Triglycerides: 80 mg/dL (ref <150)
VLDL: 16 mg/dL (ref 0–40)

## 2014-01-02 LAB — POCT GLYCOSYLATED HEMOGLOBIN (HGB A1C): Hemoglobin A1C: 5.8

## 2014-01-02 MED ORDER — INSULIN GLARGINE 100 UNIT/ML ~~LOC~~ SOLN: 30.0000 [IU] | Freq: Every day | SUBCUTANEOUS | Status: DC

## 2014-01-02 MED ORDER — ESOMEPRAZOLE MAGNESIUM 40 MG PO CPDR: 40.0000 mg | DELAYED_RELEASE_CAPSULE | Freq: Every day | ORAL | Status: DC

## 2014-01-02 MED ORDER — TELMISARTAN-HCTZ 80-25 MG PO TABS: 1.0000 | ORAL_TABLET | Freq: Every day | ORAL | Status: DC

## 2014-01-02 MED ORDER — DILTIAZEM HCL ER BEADS 240 MG PO CP24: 240.0000 mg | ORAL_CAPSULE | Freq: Every day | ORAL | Status: DC

## 2014-01-02 MED ORDER — ALBUTEROL SULFATE HFA 108 (90 BASE) MCG/ACT IN AERS: 2.0000 | INHALATION_SPRAY | Freq: Four times a day (QID) | RESPIRATORY_TRACT | Status: DC | PRN

## 2014-01-02 MED ORDER — GLIPIZIDE 5 MG PO TABS: 2.5000 mg | ORAL_TABLET | Freq: Every day | ORAL | Status: DC

## 2014-01-02 MED ORDER — SITAGLIPTIN PHOSPHATE 50 MG PO TABS: 50.0000 mg | ORAL_TABLET | Freq: Every day | ORAL | Status: DC

## 2014-01-02 NOTE — Assessment & Plan Note (Addendum)
She signed release of information from [facility] for mammogram report. We are working on getting her to see another GI provider for colonoscopy. Follow up for PAP.

## 2014-01-02 NOTE — Assessment & Plan Note (Signed)
BP looks good today. I refilled her Dialtiazem 240 mg qd and Telmisartan/HCTZ qd.  May continue current regimen.

## 2014-01-02 NOTE — Assessment & Plan Note (Signed)
Already established with weight loss clinic. I encouraged her to attend her weight loss seminar at [facility]. She needs this prior to scheduling or approving her bariatric surgery.

## 2014-01-02 NOTE — Progress Notes (Signed)
Subjective:     Patient ID: Kristina Hendricks, female   DOB: [birth year], 53 y.o.   MRN: [id number]  HPI  HTN/DM:Complinat with medications,denies any concern, here for follow up. Obesity:She was referred to weight loss clinic, she is now scheduled to attend weigh loss seminar in Jan as part of the weight loss plan process. Hep C: Diagnosed in 2012, got infected through her husband who also had the disease ( past away few years back). She stated her mom died of Heb C, she is worried and will like to get treatment. Denies any other concern. HM: She mentioned she had mammogram this year at [facility] which was normal. She is due for colonoscopy. She is also due for PAP. She got a call from GI and was informed they are not taking new patients for now, hence she is needing another referral.  Current Outpatient Prescriptions on File Prior to Visit  Medication Sig Dispense Refill  . Fluticasone-Salmeterol (ADVAIR) 500-50 MCG/DOSE AEPB Inhale 1 puff into the lungs 2 (two) times daily.    Marland Kitchen ibuprofen (ADVIL,MOTRIN) 600 MG tablet Take 1 tablet (600 mg total) by mouth every 6 (six) hours as needed for moderate pain. 90 tablet 0   No current facility-administered medications on file prior to visit.   Past Medical History  Diagnosis Date  . Arthritis   . Asthma   . COPD (chronic obstructive pulmonary disease)   . Diabetes mellitus without complication   . Hypertension   . Sleep apnea       Review of Systems  Respiratory: Negative.   Cardiovascular: Negative.   Gastrointestinal: Negative.   Genitourinary: Negative.   All other systems reviewed and are negative.  Filed Vitals:   01/02/14 1031  BP: 134/76  Pulse: 88  Temp: 98.6 F (37 C)  TempSrc: Oral  Height: 5' 7.5" (1.715 m)  Weight: 312 lb (141.522 kg)       Objective:   Physical Exam  Constitutional: She is oriented to person, place, and time. She appears well-developed. No distress.  Cardiovascular: Normal rate,  regular rhythm, normal heart sounds and intact distal pulses.   No murmur heard. Pulmonary/Chest: Effort normal and breath sounds normal. No respiratory distress. She has no wheezes. She exhibits no tenderness.  Abdominal: Soft. She exhibits no distension and no mass. There is no tenderness.  Musculoskeletal: Normal range of motion. She exhibits no edema.  Neurological: She is alert and oriented to person, place, and time. No cranial nerve deficit.  Nursing note and vitals reviewed.      Assessment:     HTN DM Obesity Hep C Health maintenance     Plan:     Check problem list.

## 2014-01-02 NOTE — Assessment & Plan Note (Addendum)
A1C done today was 5.8. Will recheck in 3-4 months, if ok will cut back on her DM regimen gradually. Urine microalbuminuria checked. DM foot exam completed today. I referred her to ophthalmologist for eye exam.

## 2014-01-02 NOTE — Patient Instructions (Signed)
Diabetes and Foot Care Diabetes may cause you to have problems because of poor blood supply (circulation) to your feet and legs. This may cause the skin on your feet to become thinner, break easier, and heal more slowly. Your skin may become dry, and the skin may peel and crack. You may also have nerve damage in your legs and feet causing decreased feeling in them. You may not notice minor injuries to your feet that could lead to infections or more serious problems. Taking care of your feet is one of the most important things you can do for yourself.  HOME CARE INSTRUCTIONS  Wear shoes at all times, even in the house. Do not go barefoot. Bare feet are easily injured.  Check your feet daily for blisters, cuts, and redness. If you cannot see the bottom of your feet, use a mirror or ask someone for help.  Wash your feet with warm water (do not use hot water) and mild soap. Then pat your feet and the areas between your toes until they are completely dry. Do not soak your feet as this can dry your skin.  Apply a moisturizing lotion or petroleum jelly (that does not contain alcohol and is unscented) to the skin on your feet and to dry, brittle toenails. Do not apply lotion between your toes.  Trim your toenails straight across. Do not dig under them or around the cuticle. File the edges of your nails with an emery board or nail file.  Do not cut corns or calluses or try to remove them with medicine.  Wear clean socks or stockings every day. Make sure they are not too tight. Do not wear knee-high stockings since they may decrease blood flow to your legs.  Wear shoes that fit properly and have enough cushioning. To break in new shoes, wear them for just a few hours a day. This prevents you from injuring your feet. Always look in your shoes before you put them on to be sure there are no objects inside.  Do not cross your legs. This may decrease the blood flow to your feet.  If you find a minor scrape,  cut, or break in the skin on your feet, keep it and the skin around it clean and dry. These areas may be cleansed with mild soap and water. Do not cleanse the area with peroxide, alcohol, or iodine.  When you remove an adhesive bandage, be sure not to damage the skin around it.  If you have a wound, look at it several times a day to make sure it is healing.  Do not use heating pads or hot water bottles. They may burn your skin. If you have lost feeling in your feet or legs, you may not know it is happening until it is too late.  Make sure your health care provider performs a complete foot exam at least annually or more often if you have foot problems. Report any cuts, sores, or bruises to your health care provider immediately. SEEK MEDICAL CARE IF:   You have an injury that is not healing.  You have cuts or breaks in the skin.  You have an ingrown nail.  You notice redness on your legs or feet.  You feel burning or tingling in your legs or feet.  You have pain or cramps in your legs and feet.  Your legs or feet are numb.  Your feet always feel cold. SEEK IMMEDIATE MEDICAL CARE IF:   There is increasing redness,   swelling, or pain in or around a wound.  There is a red line that goes up your leg.  Pus is coming from a wound.  You develop a fever or as directed by your health care provider.  You notice a bad smell coming from an ulcer or wound. Document Released: 01/07/2000 Document Revised: 09/11/2012 Document Reviewed: 06/18/2012 ExitCare Patient Information 2015 [employer]. This information is not intended to replace advice given to you by your health care provider. Make sure you discuss any questions you have with your health care provider.  

## 2014-01-02 NOTE — Assessment & Plan Note (Signed)
Hepatitis panel checked today. I will consider ID referral depending on result.

## 2014-01-03 LAB — ACUTE HEP PANEL AND HEP B SURFACE AB
HCV Ab: REACTIVE — AB
HEP B S AB: NEGATIVE
Hep A IgM: NONREACTIVE
Hep B C IgM: NONREACTIVE
Hepatitis B Surface Ag: NEGATIVE

## 2014-01-03 LAB — MICROALBUMIN / CREATININE URINE RATIO: CREATININE, URINE: 49.9 mg/dL

## 2014-01-03 LAB — HIV ANTIBODY (ROUTINE TESTING W REFLEX): HIV: NONREACTIVE

## 2014-01-05 ENCOUNTER — Other Ambulatory Visit: Payer: Self-pay | Admitting: Family Medicine

## 2014-01-05 DIAGNOSIS — B1710 Acute hepatitis C without hepatic coma: Secondary | ICD-10-CM

## 2014-01-05 LAB — HEPATITIS C RNA QUANTITATIVE
HCV QUANT: 2780177 [IU]/mL — AB (ref <15)
HCV Quantitative Log: 6.44 {Log} — ABNORMAL HIGH (ref <1.18)

## 2014-01-08 ENCOUNTER — Other Ambulatory Visit: Payer: Self-pay | Admitting: *Deleted

## 2014-01-13 ENCOUNTER — Other Ambulatory Visit: Payer: Medicare Other

## 2014-01-13 DIAGNOSIS — B182 Chronic viral hepatitis C: Secondary | ICD-10-CM

## 2014-01-13 LAB — IRON: Iron: 74 ug/dL (ref 42–145)

## 2014-01-13 LAB — PROTIME-INR
INR: 1.21 (ref <1.50)
PROTHROMBIN TIME: 15.3 s — AB (ref 11.6–15.2)

## 2014-01-14 ENCOUNTER — Telehealth: Payer: Self-pay | Admitting: Family Medicine

## 2014-01-14 DIAGNOSIS — K439 Ventral hernia without obstruction or gangrene: Secondary | ICD-10-CM | POA: Diagnosis not present

## 2014-01-14 DIAGNOSIS — B1920 Unspecified viral hepatitis C without hepatic coma: Secondary | ICD-10-CM | POA: Diagnosis not present

## 2014-01-14 LAB — ANA: ANA: NEGATIVE

## 2014-01-14 NOTE — Telephone Encounter (Signed)
Pt called because [facility] said they could not perform the surgery on her Hernia. The doctor at [facility] told her that her Hep C is bad and that she needs to have her PCP call a specialist right away before it gets worse. Please call patient with an appointment and where she should go.Kristina Hendricks

## 2014-01-14 NOTE — Telephone Encounter (Signed)
LM for patient on identified VM that she was seen at specialist yesterday for labs and that they will be contacting her for an appt. Alejandro Adcox,CMA

## 2014-01-17 LAB — HEPATITIS C RNA QUANTITATIVE
HCV Quantitative Log: 6.52 {Log} — ABNORMAL HIGH (ref <1.18)
HCV Quantitative: 3348927 IU/mL — ABNORMAL HIGH (ref <15)

## 2014-01-26 LAB — HEPATITIS C GENOTYPE

## 2014-01-30 ENCOUNTER — Ambulatory Visit (INDEPENDENT_AMBULATORY_CARE_PROVIDER_SITE_OTHER): Payer: Medicare Other | Admitting: Family Medicine

## 2014-01-30 ENCOUNTER — Encounter: Payer: Self-pay | Admitting: Family Medicine

## 2014-01-30 VITALS — BP 131/82 | HR 96 | Temp 98.1°F | Wt 314.0 lb

## 2014-01-30 DIAGNOSIS — E119 Type 2 diabetes mellitus without complications: Secondary | ICD-10-CM

## 2014-01-30 DIAGNOSIS — E669 Obesity, unspecified: Secondary | ICD-10-CM

## 2014-01-30 DIAGNOSIS — I10 Essential (primary) hypertension: Secondary | ICD-10-CM | POA: Diagnosis not present

## 2014-01-30 DIAGNOSIS — F39 Unspecified mood [affective] disorder: Secondary | ICD-10-CM | POA: Insufficient documentation

## 2014-01-30 DIAGNOSIS — E1169 Type 2 diabetes mellitus with other specified complication: Secondary | ICD-10-CM

## 2014-01-30 DIAGNOSIS — B182 Chronic viral hepatitis C: Secondary | ICD-10-CM

## 2014-01-30 DIAGNOSIS — M1990 Unspecified osteoarthritis, unspecified site: Secondary | ICD-10-CM

## 2014-01-30 DIAGNOSIS — F331 Major depressive disorder, recurrent, moderate: Secondary | ICD-10-CM | POA: Diagnosis not present

## 2014-01-30 DIAGNOSIS — Z0000 Encounter for general adult medical examination without abnormal findings: Secondary | ICD-10-CM

## 2014-01-30 MED ORDER — PAROXETINE HCL ER 25 MG PO TB24: 25.0000 mg | ORAL_TABLET | Freq: Every day | ORAL | Status: DC

## 2014-01-30 MED ORDER — TRAMADOL HCL 50 MG PO TABS: 50.0000 mg | ORAL_TABLET | Freq: Three times a day (TID) | ORAL | Status: DC | PRN

## 2014-01-30 NOTE — Assessment & Plan Note (Signed)
Not currently suicidal. I started her on Paxil today. Brief counseling done. She had seen psych in the past hence I will refer again. RTC in 4-8 wks for reassessment.

## 2014-01-30 NOTE — Assessment & Plan Note (Signed)
We again helped with scheduling GI appointment today. Instuction given to provide her mammogram result. RTC in few weeks for PAP.

## 2014-01-30 NOTE — Assessment & Plan Note (Signed)
BP optimally controlled. Continue current regimen.

## 2014-01-30 NOTE — Assessment & Plan Note (Signed)
Has ID appointment scheduled. Recommendations per ID.

## 2014-01-30 NOTE — Progress Notes (Signed)
Subjective:     Patient ID: Kristina Hendricks, female   DOB: 02-Apr-1960, 54 y.o.   MRN: [id number]  HPI  IHK:[id number] with her medication, denies any concern, here for follow up. DM:She is compliant with her medications, here for follow up, she denies hypoglycemic episodes. Hep C: Patient mentioned she now has an appointment ID. Depression: For years, she was initially on medications but she stopped them due to side effect. Now that she has many health problem this is affecting her and making her depression worse. Denies any suicidal ideation, although at time she feels like giving up, feels hopeless. Stopped psy 4 yrs. Joint pain: She is requesting refill of Ibuprofen 800mg  which she uses for arthritis, pain is getting worse especially during the winter, she has pain in her knees and wrist. She also has pain in her back. Pain is about 8/10 in severity. Denies any recent injury to her back. HM: Yet to get PAP or colonoscopy done, she stated she had mammogram done recently. She presented a letter from her previous GI informing her that she is due for colonoscopy.   Current Outpatient Prescriptions on File Prior to Visit  Medication Sig Dispense Refill  . albuterol (PROVENTIL HFA;VENTOLIN HFA) 108 (90 BASE) MCG/ACT inhaler Inhale 2 puffs into the lungs every 6 (six) hours as needed for wheezing or shortness of breath. 18 g 4  . diltiazem (TIAZAC) 240 MG 24 hr capsule Take 1 capsule (240 mg total) by mouth daily. 90 capsule 1  . esomeprazole (NEXIUM) 40 MG capsule Take 1 capsule (40 mg total) by mouth daily at 12 noon. 90 capsule 1  . Fluticasone-Salmeterol (ADVAIR) 500-50 MCG/DOSE AEPB Inhale 1 puff into the lungs 2 (two) times daily.    Marland Kitchen glipiZIDE (GLUCOTROL) 5 MG tablet Take 0.5 tablets (2.5 mg total) by mouth daily before breakfast. 45 tablet 1  . ibuprofen (ADVIL,MOTRIN) 600 MG tablet Take 1 tablet (600 mg total) by mouth every 6 (six) hours as needed for moderate pain. 90 tablet 0  . insulin  glargine (LANTUS) 100 UNIT/ML injection Inject 0.3 mLs (30 Units total) into the skin at bedtime. 10 mL 6  . sitaGLIPtin (JANUVIA) 50 MG tablet Take 1 tablet (50 mg total) by mouth daily. 90 tablet 1  . telmisartan-hydrochlorothiazide (MICARDIS HCT) 80-25 MG per tablet Take 1 tablet by mouth daily. 90 tablet 1   No current facility-administered medications on file prior to visit.   Past Medical History  Diagnosis Date  . Arthritis   . Asthma   . COPD (chronic obstructive pulmonary disease)   . Diabetes mellitus without complication   . Hypertension   . Sleep apnea       Review of Systems  Respiratory: Negative.   Cardiovascular: Negative.   Gastrointestinal: Negative.   Musculoskeletal: Positive for back pain and arthralgias. Negative for joint swelling.  Psychiatric/Behavioral: Negative for agitation.       Depressed mood.  All other systems reviewed and are negative.      Filed Vitals:   01/30/14 0929  BP: 131/82  Pulse: 96  Temp: 98.1 F (36.7 C)  TempSrc: Oral  Weight: 314 lb (142.429 kg)    Objective:   Physical Exam  Constitutional: She is oriented to person, place, and time. She appears well-developed. No distress.  Cardiovascular: Normal rate, regular rhythm, normal heart sounds and intact distal pulses.   No murmur heard. Pulmonary/Chest: Effort normal and breath sounds normal. No respiratory distress. She has no wheezes. She exhibits no  tenderness.  Abdominal: Soft. Bowel sounds are normal. She exhibits no distension and no mass. There is no tenderness.  Musculoskeletal: Normal range of motion. She exhibits no edema.       Lumbar back: Normal.  Neurological: She is alert and oriented to person, place, and time.  Psychiatric: Her speech is normal and behavior is normal. Thought content normal. Her mood appears not anxious. She exhibits a depressed mood. She expresses no homicidal and no suicidal ideation.  Nursing note and vitals reviewed.       Assessment:     HTN DM2 Hep C Depression Arthritis Health maintenance     Plan:     Check problem list.

## 2014-01-30 NOTE — Assessment & Plan Note (Signed)
Multiple joints including back, wrist and knees. On high dose Ibuprofen in patient with Liver disease. I recommended she d/c Ibuprofen and start Tramadol instead. She agreed with plan.

## 2014-01-30 NOTE — Patient Instructions (Signed)

## 2014-01-30 NOTE — Assessment & Plan Note (Signed)
Doing well in general on current regimen. Return for A1C in 2-3 months.

## 2014-02-04 ENCOUNTER — Ambulatory Visit (INDEPENDENT_AMBULATORY_CARE_PROVIDER_SITE_OTHER): Payer: Medicare Other | Admitting: Internal Medicine

## 2014-02-04 VITALS — BP 102/59 | HR 88 | Temp 97.3°F | Wt 314.0 lb

## 2014-02-04 DIAGNOSIS — B182 Chronic viral hepatitis C: Secondary | ICD-10-CM | POA: Diagnosis not present

## 2014-02-04 DIAGNOSIS — Z0000 Encounter for general adult medical examination without abnormal findings: Secondary | ICD-10-CM

## 2014-02-04 DIAGNOSIS — K7460 Unspecified cirrhosis of liver: Secondary | ICD-10-CM | POA: Diagnosis not present

## 2014-02-04 MED ORDER — LEDIPASVIR-SOFOSBUVIR 90-400 MG PO TABS: 1.0000 | ORAL_TABLET | Freq: Every day | ORAL | Status: DC

## 2014-02-04 NOTE — Patient Instructions (Signed)
Date 02/04/2014  Dear Ms Bobbye Charleston, As discussed in the [facility], your hepatitis C therapy will include the following medications:          Harvoni 90mg /400mg  tablet:           Take 1 tablet by mouth once daily   Please note that ALL MEDICATIONS WILL START ON THE SAME DATE for a total of 12 weeks. ---------------------------------------------------------------- Your HCV Treatment Start Date: TBA   Your HCV genotype:  1a    Liver Fibrosis: cirrhosis   ---------------------------------------------------------------- YOUR PHARMACY CONTACT:   [place] [facility] [place] Phone: [phone] Hours: Monday to Friday 7:30 am to 6:00 pm   Please always contact your pharmacy at least 3-4 business days before you run out of medications to ensure your next month's medication is ready or 1 week prior to running out if you receive it by mail.  Remember, each prescription is for 28 days. ---------------------------------------------------------------- GENERAL NOTES REGARDING YOUR HEPATITIS C MEDICATION:  SOFOSBUVIR/LEDIPASVIR (HARVONI): - Harvoni tablet is taken daily with OR without food. - The tablets are orange. - The tablets should be stored at room temperature.  - Acid reducing agents such as H2 blockers (ie. Pepcid (famotidine), Zantac (ranitidine), Tagamet (cimetidine), Axid (nizatidine) and proton pump inhibitors (ie. Prilosec (omeprazole), Protonix (pantoprazole), Nexium (esomeprazole), or Aciphex (rabeprazole)) can decrease effectiveness of Harvoni. Do not take until you have discussed with a health care provider.    -Antacids that contain magnesium and/or aluminum hydroxide (ie. Milk of Magensia, Rolaids, Gaviscon, Maalox, Mylanta, an dArthritis Pain Formula)can reduce absorption of Harvoni, so take them at least 4 hours before or after Harvoni.  -Calcium carbonate (calcium supplements or antacids such as Tums,  Caltrate, Os-Cal)needs to be taken at least 4 hours hours before or after Harvoni.  -St. John's wort or any products that contain St. John's wort like some herbal supplements  Please inform the office prior to starting any of these medications.  - The common side effects with Harvoni:      1. Fatigue      2. Headache      3. Nausea      4. Diarrhea      5. Insomnia   Support Path is a suite of resources designed to help patients start with HARVONI and move toward treatment completion [place] helps patients access therapy and get off to an efficient start  Benefits investigation and prior authorization support Co-pay and other financial assistance A specialty pharmacy finder CO-PAY COUPON The [place] co-pay coupon may help eligible patients lower their out-of-pocket costs. With a co-pay coupon, most eligible patients may pay no more than $5 per co-pay (restrictions apply) www.harvoni.com call (386)853-1220 Not valid for patients enrolled in government healthcare prescription drug programs, such as Medicare Part D and Medicaid. Patients in the coverage gap known as the "donut hole" also are not eligible The HARVONI co-pay coupon program will cover the out-of-pocket costs for HARVONI prescriptions up to a maximum of 25% of the catalog price of a 12-week regimen of HARVONI  Please note that this only lists the most common side effects and is NOT a comprehensive list of the potential side effects of these medications. For more information, please review the drug information sheets that come with your medication package from the pharmacy.  ---------------------------------------------------------------- GENERAL HELPFUL HINTS ON HCV THERAPY: 1. No alcohol. 2. Protect against sun-sensitivity/sunburns (wear sunglasses, hat, long sleeves, pants and  sunscreen). 3. Stay well-hydrated/well-moisturized. 4. Notify the ID Clinic of any changes in your other  over-the-counter/herbal or prescription medications. 5. If you miss a dose of your medication, take the missed dose as soon as you remember. Return to your regular time/dose schedule the next day.  6.  Do not stop taking your medications without first talking with your healthcare provider. 7.  You may take Tylenol (acetaminophen), as long as the dose is less than 2000 mg (OR no more than 4 tablets of the Tylenol Extra Strengths 500mg  tablet) in 24 hours. 8.  You will need to obtain routine labs and/or office visits at [facility] at weeks 2, 4, 8,  and 12 as well as 12 and 24 weeks after completion of treatment.   Scharlene Gloss, [place] for [address] [place], [place]  [phone]

## 2014-02-04 NOTE — Progress Notes (Signed)
+Kristina Hendricks is a 54 y.o. female who presents for initial evaluation and management of a positive Hepatitis C antibody test.  Patient tested positive 2006. Hepatitis C risk factors present are: none. Patient denies intranasal drug use, IV drug abuse, multiple sexual partners, sexual contact with person with liver disease, tattoos. Patient has had other studies performed. Results: hepatitis C RNA by PCR, result: positive. Patient has not had prior treatment for Hepatitis C. Patient does have a past history of liver disease. Patient does have a family history of liver disease.   HPI: She was diagnosed in 2006 but did not have a liver biopsy then nor was offered treatment.  She has been reevaluated recently and noted cirrhosis on CT scan.  Normal platelets and minimally decreased albumin.     Also has a history of colon polyps and due for screening colonoscopy.   Patient does not have documented immunity to Hepatitis A. Patient does not have documented immunity to Hepatitis B.     Review of Systems A comprehensive review of systems was negative.   Past Medical History  Diagnosis Date  . Arthritis   . Asthma   . COPD (chronic obstructive pulmonary disease)   . Diabetes mellitus without complication   . Hypertension   . Sleep apnea     Prior to Admission medications   Medication Sig Start Date End Date Taking? Authorizing Provider  albuterol (PROVENTIL HFA;VENTOLIN HFA) 108 (90 BASE) MCG/ACT inhaler Inhale 2 puffs into the lungs every 6 (six) hours as needed for wheezing or shortness of breath. 01/02/14  Yes Andrena Mews, MD  diltiazem (TIAZAC) 240 MG 24 hr capsule Take 1 capsule (240 mg total) by mouth daily. 01/02/14  Yes Andrena Mews, MD  esomeprazole (NEXIUM) 40 MG capsule Take 1 capsule (40 mg total) by mouth daily at 12 noon. 01/02/14  Yes Andrena Mews, MD  Fluticasone-Salmeterol (ADVAIR) 500-50 MCG/DOSE AEPB Inhale 1 puff into the lungs 2 (two) times daily.   Yes Historical  Provider, MD  glipiZIDE (GLUCOTROL) 5 MG tablet Take 0.5 tablets (2.5 mg total) by mouth daily before breakfast. 01/02/14  Yes Andrena Mews, MD  insulin glargine (LANTUS) 100 UNIT/ML injection Inject 0.3 mLs (30 Units total) into the skin at bedtime. 01/02/14  Yes Andrena Mews, MD  PARoxetine (PAXIL-CR) 25 MG 24 hr tablet Take 1 tablet (25 mg total) by mouth daily. 01/30/14  Yes Andrena Mews, MD  sitaGLIPtin (JANUVIA) 50 MG tablet Take 1 tablet (50 mg total) by mouth daily. 01/02/14  Yes Andrena Mews, MD  telmisartan-hydrochlorothiazide (MICARDIS HCT) 80-25 MG per tablet Take 1 tablet by mouth daily. 01/02/14  Yes Andrena Mews, MD  traMADol (ULTRAM) 50 MG tablet Take 1 tablet (50 mg total) by mouth every 8 (eight) hours as needed. 01/30/14  Yes Andrena Mews, MD    No Known Allergies  History  Substance Use Topics  . Smoking status: Former Smoker    Quit date: 10/24/2011  . Smokeless tobacco: Not on file  . Alcohol Use: Not on file    Family History  Problem Relation Age of Onset  . Alcohol abuse Mother   . Drug abuse Mother   . Cancer Mother   . Depression Mother   . Diabetes Mother   . Hypertension Mother   . Heart disease Father   . Depression Father   . Diabetes Father   . Hypertension Father       Objective:   Filed Vitals:   02/04/14 1604  BP: 102/59  Pulse: 88  Temp: 97.3 F (36.3 C)   in no apparent distress and alert HEENT: anicteric Cor RRR and No murmurs clear Bowel sounds are normal, liver is not enlarged, spleen is not enlarged peripheral pulses normal, no pedal edema, no clubbing or cyanosis negative for - jaundice, spider hemangioma, telangiectasia, palmar erythema, ecchymosis and atrophy  Laboratory Genotype:  Lab Results  Component Value Date   HCVGENOTYPE 1a 01/13/2014   HCV viral load:  Lab Results  Component Value Date   HCVQUANT [id number]* 01/13/2014   Lab Results  Component Value Date   WBC 5.7 12/05/2013   HGB 12.9  12/05/2013   HCT 38.9 12/05/2013   MCV 85.1 12/05/2013   PLT 162 12/05/2013    Lab Results  Component Value Date   CREATININE 0.60 12/05/2013   BUN 9 12/05/2013   NA 136* 12/05/2013   K 3.5* 12/05/2013   CL 99 12/05/2013   CO2 21 12/05/2013    Lab Results  Component Value Date   ALT 59* 12/05/2013   AST 74* 12/05/2013   ALKPHOS 92 12/05/2013   BILITOT 1.1 12/05/2013   INR 1.21 01/13/2014      Assessment: Hepatitis C genotype 1a  Plan: 1) Patient counseled extensively on limiting acetaminophen to no more than 2 grams daily, avoidance of alcohol. 2) Transmission discussed with patient including sexual transmission, sharing razors and toothbrush.   3) Will need referral to gastroenterology due to cirrhosis.  She also is due for colonoscopy according to her with a history of polyps noted in another city.   4) Will need referral for substance abuse counseling: No. 5) Will prescribe Harvoni for 12 weeks now. 6) Hepatitis A vaccine Yes.   7) Hepatitis B vaccine Yes.   8) Pneumovax vaccine  9) will do ultrasound every 6 months for HCC screening 10) will follow up after starting medication 11) will need to hold ppi on Harvoni

## 2014-02-11 ENCOUNTER — Telehealth: Payer: Self-pay | Admitting: *Deleted

## 2014-02-11 NOTE — Telephone Encounter (Signed)
Patient called and advised she has not been having normal bowel movements and wondered if there is anything she can take to help herself move. After speaking with the pharmacist advised the patient to increase her water and she can take Miralax. Advised her to call the office back if she has any other problems.

## 2014-02-27 ENCOUNTER — Other Ambulatory Visit (HOSPITAL_COMMUNITY)
Admission: RE | Admit: 2014-02-27 | Discharge: 2014-02-27 | Disposition: A | Discharge status: Home / Self Care | Payer: Medicare Other | Source: Ambulatory Visit | Attending: Family Medicine | Admitting: Family Medicine

## 2014-02-27 ENCOUNTER — Ambulatory Visit (INDEPENDENT_AMBULATORY_CARE_PROVIDER_SITE_OTHER): Payer: Medicare Other | Admitting: Family Medicine

## 2014-02-27 ENCOUNTER — Encounter: Payer: Self-pay | Admitting: Family Medicine

## 2014-02-27 VITALS — BP 142/88 | HR 70 | Temp 98.1°F | Ht 68.0 in | Wt 319.0 lb

## 2014-02-27 DIAGNOSIS — Z1151 Encounter for screening for human papillomavirus (HPV): Secondary | ICD-10-CM | POA: Diagnosis not present

## 2014-02-27 DIAGNOSIS — Z124 Encounter for screening for malignant neoplasm of cervix: Secondary | ICD-10-CM | POA: Insufficient documentation

## 2014-02-27 DIAGNOSIS — Z1211 Encounter for screening for malignant neoplasm of colon: Secondary | ICD-10-CM | POA: Insufficient documentation

## 2014-02-27 DIAGNOSIS — E669 Obesity, unspecified: Secondary | ICD-10-CM

## 2014-02-27 DIAGNOSIS — E1169 Type 2 diabetes mellitus with other specified complication: Secondary | ICD-10-CM

## 2014-02-27 DIAGNOSIS — Z01419 Encounter for gynecological examination (general) (routine) without abnormal findings: Secondary | ICD-10-CM | POA: Diagnosis not present

## 2014-02-27 DIAGNOSIS — I10 Essential (primary) hypertension: Secondary | ICD-10-CM

## 2014-02-27 DIAGNOSIS — F331 Major depressive disorder, recurrent, moderate: Secondary | ICD-10-CM | POA: Diagnosis not present

## 2014-02-27 DIAGNOSIS — E119 Type 2 diabetes mellitus without complications: Secondary | ICD-10-CM

## 2014-02-27 MED ORDER — PAROXETINE HCL 20 MG PO TABS: 20.0000 mg | ORAL_TABLET | Freq: Every day | ORAL | Status: DC

## 2014-02-27 NOTE — Assessment & Plan Note (Signed)
Doing very well on Paxil CR but since insurance will not cover this I switched her to Paxil 20 mg qd. Patient instructed to pick up new prescription as soon as she is out of her Paxil CR. She agreed with plan.

## 2014-02-27 NOTE — Progress Notes (Signed)
Subjective:     Patient ID: Kristina Hendricks, female   DOB: 08-14-1960, 54 y.o.   MRN: [id number]  HPI  PAP: here for gyne exam, no GU concern. Depression:Patient stated she is doing well on Paxil CR but she got a letter from her insurance that she will only get one time payment for this medication and that she need to change to something covered by her insurance.  HTN:She is compliant with her medications, she is here for follow up. DM2: She is compliant with medication, no concern, she is here for follow up. Colon cancer screening: Still yet to here back from GI to schedule appointment.  Current Outpatient Prescriptions on File Prior to Visit  Medication Sig Dispense Refill  . albuterol (PROVENTIL HFA;VENTOLIN HFA) 108 (90 BASE) MCG/ACT inhaler Inhale 2 puffs into the lungs every 6 (six) hours as needed for wheezing or shortness of breath. 18 g 4  . diltiazem (TIAZAC) 240 MG 24 hr capsule Take 1 capsule (240 mg total) by mouth daily. 90 capsule 1  . Fluticasone-Salmeterol (ADVAIR) 500-50 MCG/DOSE AEPB Inhale 1 puff into the lungs 2 (two) times daily.    Marland Kitchen glipiZIDE (GLUCOTROL) 5 MG tablet Take 0.5 tablets (2.5 mg total) by mouth daily before breakfast. 45 tablet 1  . insulin glargine (LANTUS) 100 UNIT/ML injection Inject 0.3 mLs (30 Units total) into the skin at bedtime. 10 mL 6  . Ledipasvir-Sofosbuvir (HARVONI) 90-400 MG TABS Take 1 tablet by mouth daily. 28 tablet 2  . sitaGLIPtin (JANUVIA) 50 MG tablet Take 1 tablet (50 mg total) by mouth daily. 90 tablet 1  . telmisartan-hydrochlorothiazide (MICARDIS HCT) 80-25 MG per tablet Take 1 tablet by mouth daily. 90 tablet 1  . traMADol (ULTRAM) 50 MG tablet Take 1 tablet (50 mg total) by mouth every 8 (eight) hours as needed. 90 tablet 1  . esomeprazole (NEXIUM) 40 MG capsule Take 1 capsule (40 mg total) by mouth daily at 12 noon. (Patient not taking: Reported on 02/27/2014) 90 capsule 1   No current facility-administered medications on file prior  to visit.   Past Medical History  Diagnosis Date  . Arthritis   . Asthma   . COPD (chronic obstructive pulmonary disease)   . Diabetes mellitus without complication   . Hypertension   . Sleep apnea       Review of Systems  Respiratory: Negative.   Cardiovascular: Negative.   Gastrointestinal: Negative.   Genitourinary: Negative.   All other systems reviewed and are negative.  Filed Vitals:   02/27/14 0832 02/27/14 0900  BP: 150/109 142/88  Pulse: 70   Temp: 98.1 F (36.7 C)   TempSrc: Oral   Height: 5\' 8"  (1.727 m)   Weight: 319 lb (144.697 kg)        Objective:   Physical Exam  Constitutional: She is oriented to person, place, and time. She appears well-developed. No distress.  Cardiovascular: Normal rate, regular rhythm and normal heart sounds.   No murmur heard. Pulmonary/Chest: Effort normal and breath sounds normal. No respiratory distress. She has no wheezes.  Abdominal: Soft. Bowel sounds are normal. She exhibits no distension and no mass. There is no tenderness.  Genitourinary: Vagina normal and uterus normal. There is no tenderness on the right labia. There is no tenderness on the left labia. Cervix exhibits no motion tenderness and no discharge. Right adnexum displays no mass, no tenderness and no fullness. Left adnexum displays no mass, no tenderness and no fullness. No vaginal discharge found.  Musculoskeletal: Normal range of motion. She exhibits no edema.  Neurological: She is alert and oriented to person, place, and time.  Psychiatric: She has a normal mood and affect. Her behavior is normal. Judgment and thought content normal. She expresses no homicidal and no suicidal ideation.  Nursing note and vitals reviewed.      Assessment:     PAP: Depression: HTN: DM2: Colon Ca acreening     Plan:     Check problem list.

## 2014-02-27 NOTE — Patient Instructions (Signed)
Pap Test A Pap test checks the cells on the surface of your cervix. Your doctor will look for cell changes that are not normal, an infection, or cancer. If the cells no longer look normal, it is called dysplasia. Dysplasia can turn into cancer. Regular Pap tests are important to stop cancer from developing. BEFORE THE PROCEDURE  Ask your doctor when to schedule your Pap test. Timing the test around your period may be important.  Do not douche or have sex (intercourse) for 24 hours before the test.  Do not put creams on your vagina or use tampons for 24 hours before the test.  Go pee (urinate) just before the test. PROCEDURE  You will lie on an exam table with your feet in stirrups.  A warm metal or plastic tool (speculum) will be put in your vagina to open it up.  Your doctor will use a small, plastic brush or wooden spatula to take cells from your cervix.  The cells will be put in a lab container.  The cells will be checked under a microscope to see if they are normal or not. AFTER THE PROCEDURE Get your test results. If they are abnormal, you may need more tests. Document Released: 02/11/2010 Document Revised: 04/03/2011 Document Reviewed: 01/05/2011 [employer] Patient Information 2015 [employer]. This information is not intended to replace advice given to you by your health care provider. Make sure you discuss any questions you have with your health care provider.  

## 2014-02-27 NOTE — Assessment & Plan Note (Signed)
Compliant with treatment regimen. No change at this time.

## 2014-02-27 NOTE — Assessment & Plan Note (Signed)
PAP done today. I will call her with result.

## 2014-02-27 NOTE — Assessment & Plan Note (Signed)
BP initially elevated during this visit. Few min after recheck it came down with systolic BP of 151V. Continue current regimen for now.

## 2014-02-27 NOTE — Assessment & Plan Note (Signed)
Referral done during last visit. I suggested she contact GI office to check with appointment scheduling. She agreed with plan.

## 2014-03-02 ENCOUNTER — Encounter: Payer: Self-pay | Admitting: *Deleted

## 2014-03-02 NOTE — Progress Notes (Signed)
This is not needed. Paxil 20 mg went through. I just spoke with the pharmacy who confirmed this.

## 2014-03-02 NOTE — Progress Notes (Signed)
Prior Authorization received from [facility] for paroxetine ER 25 mg.  PA form placed in provider box for completion. Derl Barrow, RN

## 2014-03-04 LAB — CYTOLOGY - PAP

## 2014-03-05 ENCOUNTER — Telehealth: Payer: Self-pay | Admitting: Family Medicine

## 2014-03-05 NOTE — Telephone Encounter (Signed)
Unless you have received records from [facility] on this patient from GI we can't get an appt with [facility].  They require previous records before scheduling an appt.  I have faxed ROI twice. Ivette Castronova,CMA

## 2014-03-05 NOTE — Telephone Encounter (Signed)
PAP test normal, result discussed with patient.  NB: She is concern she has not yet heard back from GI for her referral.  [facility] team ladies could you please help look into her GI referral? Is there a reason why they had not called her yet for her appointment? Do I need to put in another referral? Please let me know.  Thanks.

## 2014-03-05 NOTE — Telephone Encounter (Signed)
Thanks, record given to Jazmin to fax today.

## 2014-03-06 ENCOUNTER — Other Ambulatory Visit: Payer: Self-pay | Admitting: Internal Medicine

## 2014-03-06 ENCOUNTER — Other Ambulatory Visit: Payer: Medicare Other

## 2014-03-06 DIAGNOSIS — B1920 Unspecified viral hepatitis C without hepatic coma: Secondary | ICD-10-CM

## 2014-03-06 LAB — CBC
HCT: 38.2 % (ref 36.0–46.0)
Hemoglobin: 12.8 g/dL (ref 12.0–15.0)
MCH: 28.7 pg (ref 26.0–34.0)
MCHC: 33.5 g/dL (ref 30.0–36.0)
MCV: 85.7 fL (ref 78.0–100.0)
MPV: 9.8 fL (ref 8.6–12.4)
Platelets: 169 10*3/uL (ref 150–400)
RBC: 4.46 MIL/uL (ref 3.87–5.11)
RDW: 14.8 % (ref 11.5–15.5)
WBC: 5.9 10*3/uL (ref 4.0–10.5)

## 2014-03-09 LAB — HEPATITIS C RNA QUANTITATIVE
HCV Quantitative Log: <1.18 {Log} (ref <1.18)
HCV Quantitative: <15 IU/mL (ref <15)

## 2014-03-12 ENCOUNTER — Ambulatory Visit (INDEPENDENT_AMBULATORY_CARE_PROVIDER_SITE_OTHER): Payer: Medicare Other | Admitting: Internal Medicine

## 2014-03-12 ENCOUNTER — Encounter: Payer: Self-pay | Admitting: Internal Medicine

## 2014-03-12 VITALS — BP 117/77 | HR 62 | Temp 97.6°F | Ht 68.0 in | Wt 315.0 lb

## 2014-03-12 DIAGNOSIS — K7460 Unspecified cirrhosis of liver: Secondary | ICD-10-CM | POA: Diagnosis not present

## 2014-03-12 DIAGNOSIS — Z23 Encounter for immunization: Secondary | ICD-10-CM | POA: Diagnosis not present

## 2014-03-12 DIAGNOSIS — B182 Chronic viral hepatitis C: Secondary | ICD-10-CM

## 2014-03-12 NOTE — Progress Notes (Signed)
   Subjective:    Patient ID: Kristina Hendricks, female    DOB: 11-28-1960, 54 y.o.   MRN: [id number]  HPI She comes in for follow-up of hepatitis C. She has genotype 1A, viral load of 3 million and CT with noted cirrhosis of the liver. She has been referred to gastroenterology by her primary physician. She is hepatitis A and B non-immune. She has some fatigue and some headache that is mild due to her Harvoni. She has some reflux symptoms.   Review of Systems  Constitutional: Positive for fatigue.  Skin: Negative for rash.  Neurological: Positive for headaches.       Objective:   Physical Exam  Constitutional: She appears well-developed and well-nourished. No distress.  Eyes: No scleral icterus.  Cardiovascular: Normal rate, regular rhythm and normal heart sounds.   No murmur heard. Pulmonary/Chest: Effort normal and breath sounds normal. No respiratory distress.  Skin: No rash noted.          Assessment & Plan:

## 2014-03-12 NOTE — Addendum Note (Signed)
Addended by: Landis Gandy on: 03/12/2014 11:12 AM   Modules accepted: Orders

## 2014-03-12 NOTE — Assessment & Plan Note (Signed)
She is doing well with her medication and pleased with results. She will get hepatitis B #2 today. Return in one month.

## 2014-03-12 NOTE — Assessment & Plan Note (Signed)
She has been referred to gastroenterology by her primary physician. She will need ultrasound every 6 months.

## 2014-03-30 ENCOUNTER — Telehealth: Payer: Self-pay | Admitting: Family Medicine

## 2014-03-30 NOTE — Telephone Encounter (Signed)
Pt was wondering if Dr. Gwendlyn Deutscher has scheduled for her colonoscopy yet. Please call pt with info at [phone] / thanks sr

## 2014-03-30 NOTE — Telephone Encounter (Signed)
Spoke with patient and informed her of appt with Dr. Paulita Fujita on Thursday March 17 @ 3:45pm. Johnney Ou

## 2014-04-01 ENCOUNTER — Telehealth: Payer: Self-pay | Admitting: *Deleted

## 2014-04-01 ENCOUNTER — Telehealth: Payer: Self-pay | Admitting: Family Medicine

## 2014-04-01 NOTE — Telephone Encounter (Signed)
Started taking Harvoni Jan 16.  She says she didn't get her monthly in Feb and still hasnt come on Could the medication be causing this or could she be starting menopause? Please advise

## 2014-04-01 NOTE — Telephone Encounter (Signed)
I will discuss with her during next visit if this continues to become an issue.

## 2014-04-01 NOTE — Telephone Encounter (Signed)
Will forward to MD. Abou Sterkel,CMA  

## 2014-04-01 NOTE — Telephone Encounter (Signed)
agreed

## 2014-04-01 NOTE — Telephone Encounter (Signed)
To  My knowledge Harvoni does not affect menstrual cycle, she could be reaching menopause. I will assess her during next visit.

## 2014-04-01 NOTE — Telephone Encounter (Signed)
Pt voiced understanding. Jazmin Hartsell,CMA

## 2014-04-01 NOTE — Telephone Encounter (Signed)
2 pills left in second bottle, has ordered the 3rd. She noticed she has not had her monthly cycle since starting the medicine in January, would like to know if that is a potential side effect.  Patient is sexually active, but has had a tubal ligation.  She has never had irregular menstrual cycles before.  She has noticed some other symptoms, including hot flashes.  RN advised patient to contact her PCP to set up a visit for evaluation, that it could potentially be menopause symptoms.  Patient agreed, verbalized understanding. Landis Gandy, RN

## 2014-04-07 ENCOUNTER — Encounter: Payer: Self-pay | Admitting: Family Medicine

## 2014-04-07 ENCOUNTER — Ambulatory Visit (INDEPENDENT_AMBULATORY_CARE_PROVIDER_SITE_OTHER): Payer: Medicare Other | Admitting: Family Medicine

## 2014-04-07 VITALS — BP 131/86 | HR 71 | Temp 98.2°F | Ht 68.0 in | Wt 316.0 lb

## 2014-04-07 DIAGNOSIS — I10 Essential (primary) hypertension: Secondary | ICD-10-CM | POA: Diagnosis not present

## 2014-04-07 DIAGNOSIS — E1169 Type 2 diabetes mellitus with other specified complication: Secondary | ICD-10-CM

## 2014-04-07 DIAGNOSIS — E669 Obesity, unspecified: Secondary | ICD-10-CM | POA: Diagnosis not present

## 2014-04-07 DIAGNOSIS — M1990 Unspecified osteoarthritis, unspecified site: Secondary | ICD-10-CM | POA: Diagnosis not present

## 2014-04-07 DIAGNOSIS — E119 Type 2 diabetes mellitus without complications: Secondary | ICD-10-CM

## 2014-04-07 DIAGNOSIS — N926 Irregular menstruation, unspecified: Secondary | ICD-10-CM | POA: Insufficient documentation

## 2014-04-07 LAB — POCT GLYCOSYLATED HEMOGLOBIN (HGB A1C): Hemoglobin A1C: 6.2

## 2014-04-07 MED ORDER — TRAMADOL HCL 50 MG PO TABS: 50.0000 mg | ORAL_TABLET | Freq: Three times a day (TID) | ORAL | Status: DC | PRN

## 2014-04-07 NOTE — Progress Notes (Signed)
Subjective:     Patient ID: Kristina Hendricks, female   DOB: 04-Jul-1960, 54 y.o.   MRN: [id number]  HPI HTN/DM2:Doing well on her medications, denies any concern. Her home CBG ranges from 90 to 110, denies hypoglycemic episodes. Menstrual issue: C/O because she missed her period in Feb, she normally have period on the 15th of every month. On March 11th she  Started her menses, lasted for 3 days and it was heavier than usual. Her period normally last 3 days. She had suprapubic cramping with menses which has now resolved.She is concern this change in her period is due to [facility]. Arthritis: Doing well on Tramadol, she needs refill.  Current Outpatient Prescriptions on File Prior to Visit  Medication Sig Dispense Refill  . albuterol (PROVENTIL HFA;VENTOLIN HFA) 108 (90 BASE) MCG/ACT inhaler Inhale 2 puffs into the lungs every 6 (six) hours as needed for wheezing or shortness of breath. 18 g 4  . diltiazem (TIAZAC) 240 MG 24 hr capsule Take 1 capsule (240 mg total) by mouth daily. 90 capsule 1  . Fluticasone-Salmeterol (ADVAIR) 500-50 MCG/DOSE AEPB Inhale 1 puff into the lungs 2 (two) times daily.    Marland Kitchen glipiZIDE (GLUCOTROL) 5 MG tablet Take 0.5 tablets (2.5 mg total) by mouth daily before breakfast. 45 tablet 1  . insulin glargine (LANTUS) 100 UNIT/ML injection Inject 0.3 mLs (30 Units total) into the skin at bedtime. 10 mL 6  . Ledipasvir-Sofosbuvir (HARVONI) 90-400 MG TABS Take 1 tablet by mouth daily. 28 tablet 2  . PARoxetine (PAXIL) 20 MG tablet Take 1 tablet (20 mg total) by mouth daily. 30 tablet 3  . sitaGLIPtin (JANUVIA) 50 MG tablet Take 1 tablet (50 mg total) by mouth daily. 90 tablet 1  . telmisartan-hydrochlorothiazide (MICARDIS HCT) 80-25 MG per tablet Take 1 tablet by mouth daily. 90 tablet 1  . traMADol (ULTRAM) 50 MG tablet Take 1 tablet (50 mg total) by mouth every 8 (eight) hours as needed. 90 tablet 1  . B-D ULTRAFINE III SHORT PEN 31G X 8 MM MISC   4  . esomeprazole (NEXIUM) 40 MG  capsule Take 1 capsule (40 mg total) by mouth daily at 12 noon. (Patient not taking: Reported on 02/27/2014) 90 capsule 1   No current facility-administered medications on file prior to visit.   Past Medical History  Diagnosis Date  . Arthritis   . Asthma   . COPD (chronic obstructive pulmonary disease)   . Diabetes mellitus without complication   . Hypertension   . Sleep apnea      Review of Systems  Respiratory: Negative.   Cardiovascular: Negative.   Gastrointestinal: Negative.   Genitourinary: Positive for menstrual problem.  Neurological: Negative.   All other systems reviewed and are negative.  Filed Vitals:   04/07/14 0902  BP: 131/86  Pulse: 71  Temp: 98.2 F (36.8 C)  TempSrc: Oral  Height: 5\' 8"  (1.727 m)  Weight: 316 lb (143.337 kg)     Objective:   Physical Exam  Constitutional: She is oriented to person, place, and time. She appears well-developed. No distress.  Cardiovascular: Normal rate, regular rhythm, normal heart sounds and intact distal pulses.   No murmur heard. Pulmonary/Chest: Effort normal and breath sounds normal. No respiratory distress. She has no wheezes.  Abdominal: Soft. Bowel sounds are normal. She exhibits no distension. There is no tenderness.  Musculoskeletal: Normal range of motion. She exhibits no edema.  Neurological: She is alert and oriented to person, place, and time.  Nursing  note and vitals reviewed.      Assessment:     HTN: DM2: Menstrual issue Arthritis.    Plan:     Check problem list.

## 2014-04-07 NOTE — Assessment & Plan Note (Signed)
Stable on Micardis 80/25 qd and Diltiazem 240 mg qd. Continue current dose.

## 2014-04-07 NOTE — Assessment & Plan Note (Signed)
Tramadol refilled.

## 2014-04-07 NOTE — Patient Instructions (Signed)
It was nice seeing you today, I am glad you are doing well in general except for the menstrual problem. It is unclear to me that Harvoni will affect menstruation, however it could be that you are reaching menopause. Please keep your menstrual log for the next few weeks or months and if symptoms is worsening we will do some more test.   Menopause Menopause is the normal time of life when menstrual periods stop completely. Menopause is complete when you have missed 12 consecutive menstrual periods. It usually occurs between the ages of 54 years and 61 years. Very rarely does a woman develop menopause before the age of 17 years. At menopause, your ovaries stop producing the female hormones estrogen and progesterone. This can cause undesirable symptoms and also affect your health. Sometimes the symptoms may occur 4-5 years before the menopause begins. There is no relationship between menopause and:  Oral contraceptives.  Number of children you had.  Race.  The age your menstrual periods started (menarche). Heavy smokers and very thin women may develop menopause earlier in life. CAUSES  The ovaries stop producing the female hormones estrogen and progesterone.  Other causes include:  Surgery to remove both ovaries.  The ovaries stop functioning for no known reason.  Tumors of the pituitary gland in the brain.  Medical disease that affects the ovaries and hormone production.  Radiation treatment to the abdomen or pelvis.  Chemotherapy that affects the ovaries. SYMPTOMS   Hot flashes.  Night sweats.  Decrease in sex drive.  Vaginal dryness and thinning of the vagina causing painful intercourse.  Dryness of the skin and developing wrinkles.  Headaches.  Tiredness.  Irritability.  Memory problems.  Weight gain.  Bladder infections.  Hair growth of the face and chest.  Infertility. More serious symptoms include:  Loss of bone (osteoporosis) causing breaks  (fractures).  Depression.  Hardening and narrowing of the arteries (atherosclerosis) causing heart attacks and strokes. DIAGNOSIS   When the menstrual periods have stopped for 12 straight months.  Physical exam.  Hormone studies of the blood. TREATMENT  There are many treatment choices and nearly as many questions about them. The decisions to treat or not to treat menopausal changes is an individual choice made with your health care provider. Your health care provider can discuss the treatments with you. Together, you can decide which treatment will work best for you. Your treatment choices may include:   Hormone therapy (estrogen and progesterone).  Non-hormonal medicines.  Treating the individual symptoms with medicine (for example antidepressants for depression).  Herbal medicines that may help specific symptoms.  Counseling by a psychiatrist or psychologist.  Group therapy.  Lifestyle changes including:  Eating healthy.  Regular exercise.  Limiting caffeine and alcohol.  Stress management and meditation.  No treatment. HOME CARE INSTRUCTIONS   Take the medicine your health care provider gives you as directed.  Get plenty of sleep and rest.  Exercise regularly.  Eat a diet that contains calcium (good for the bones) and soy products (acts like estrogen hormone).  Avoid alcoholic beverages.  Do not smoke.  If you have hot flashes, dress in layers.  Take supplements, calcium, and vitamin D to strengthen bones.  You can use over-the-counter lubricants or moisturizers for vaginal dryness.  Group therapy is sometimes very helpful.  Acupuncture may be helpful in some cases. SEEK MEDICAL CARE IF:   You are not sure you are in menopause.  You are having menopausal symptoms and need advice and treatment.  You are still having menstrual periods after age 41 years.  You have pain with intercourse.  Menopause is complete (no menstrual period for 12 months)  and you develop vaginal bleeding.  You need a referral to a specialist (gynecologist, psychiatrist, or psychologist) for treatment. SEEK IMMEDIATE MEDICAL CARE IF:   You have severe depression.  You have excessive vaginal bleeding.  You fell and think you have a broken bone.  You have pain when you urinate.  You develop leg or chest pain.  You have a fast pounding heart beat (palpitations).  You have severe headaches.  You develop vision problems.  You feel a lump in your breast.  You have abdominal pain or severe indigestion. Document Released: 04/01/2003 Document Revised: 09/11/2012 Document Reviewed: 08/08/2012 [facility] Patient Information 2015 [place], [place]. This information is not intended to replace advice given to you by your health care provider. Make sure you discuss any questions you have with your health care provider.

## 2014-04-07 NOTE — Assessment & Plan Note (Signed)
As discussed with her it is unclear if this is due to [facility]. However it could be that she is starting Menopause. She had her period in march but was heavy. Currently not having any vaginal bleeding. We will monitor for now. To consider U/S and lab work if symptoms worsens.

## 2014-04-07 NOTE — Assessment & Plan Note (Signed)
Doing well in general. A1C checked today. I will call her with result. Continue Lantus 30 unit qd and Januvia 50 mg. Consider d/c Glipizide 5 mg qd if A1C is again good.

## 2014-04-08 ENCOUNTER — Telehealth: Payer: Self-pay | Admitting: *Deleted

## 2014-04-08 ENCOUNTER — Encounter: Payer: Self-pay | Admitting: *Deleted

## 2014-04-08 NOTE — Telephone Encounter (Signed)
Letter mailed to patient. Jazmin Hartsell,CMA  

## 2014-04-08 NOTE — Telephone Encounter (Signed)
-----   Message from Kinnie Feil, MD sent at 04/08/2014 10:16 AM EDT ----- Please inform patient her A1C looks good, continue current DM medication.

## 2014-04-09 ENCOUNTER — Ambulatory Visit (INDEPENDENT_AMBULATORY_CARE_PROVIDER_SITE_OTHER): Payer: Medicare Other | Admitting: Internal Medicine

## 2014-04-09 ENCOUNTER — Encounter: Payer: Self-pay | Admitting: Internal Medicine

## 2014-04-09 VITALS — BP 147/80 | HR 97 | Temp 97.7°F | Ht 67.5 in | Wt 314.0 lb

## 2014-04-09 DIAGNOSIS — B182 Chronic viral hepatitis C: Secondary | ICD-10-CM

## 2014-04-09 DIAGNOSIS — K7460 Unspecified cirrhosis of liver: Secondary | ICD-10-CM | POA: Diagnosis not present

## 2014-04-09 LAB — COMPLETE METABOLIC PANEL WITH GFR
ALBUMIN: 3.3 g/dL — AB (ref 3.5–5.2)
ALT: 23 U/L (ref 0–35)
AST: 31 U/L (ref 0–37)
Alkaline Phosphatase: 97 U/L (ref 39–117)
BUN: 8 mg/dL (ref 6–23)
CO2: 24 mEq/L (ref 19–32)
CREATININE: 0.6 mg/dL (ref 0.50–1.10)
Calcium: 9.2 mg/dL (ref 8.4–10.5)
Chloride: 102 mEq/L (ref 96–112)
GFR, Est African American: >89 mL/min
Glucose, Bld: 125 mg/dL — ABNORMAL HIGH (ref 70–99)
POTASSIUM: 3.6 meq/L (ref 3.5–5.3)
Sodium: 135 mEq/L (ref 135–145)
Total Bilirubin: 0.6 mg/dL (ref 0.2–1.2)
Total Protein: 7 g/dL (ref 6.0–8.3)

## 2014-04-09 NOTE — Assessment & Plan Note (Signed)
He is doing well. I will check her CMP today on Harvoni and then again end of treatment viral load in about a month. I will then see her after that.

## 2014-04-09 NOTE — Assessment & Plan Note (Addendum)
She has an appointment today with Dr. Paulita Fujita for consideration of varices screening. I will continue with an ultrasound every 6 months for [facility] screening. She has had Pneumovax and is getting the hepatitis A and B vaccine.

## 2014-04-09 NOTE — Progress Notes (Signed)
   Subjective:    Patient ID: Kristina Hendricks, female    DOB: June 24, 1960, 54 y.o.   MRN: [id number]  HPI She comes in for follow-up of hepatitis C. She has genotype 1A, viral load of 3 million and CT with noted cirrhosis of the liver. She has been referred to gastroenterology by her primary physician. She is hepatitis A and B non-immune. She has some fatigue and some headache that is mild due to her Harvoni. She has some reflux symptoms.  She is now undetectable on Harvoni.     Review of Systems  Constitutional:       Fatigue has resolved and actually has more energy than prior to treatment  Gastrointestinal: Negative for nausea and diarrhea.  Skin: Negative for rash.  Neurological: Negative for headaches.       Objective:   Physical Exam  Constitutional: She appears well-developed and well-nourished. No distress.  Eyes: No scleral icterus.  Cardiovascular: Normal rate, regular rhythm and normal heart sounds.   No murmur heard. Pulmonary/Chest: Effort normal and breath sounds normal. No respiratory distress.  Skin: No rash noted.          Assessment & Plan:

## 2014-04-21 ENCOUNTER — Other Ambulatory Visit: Payer: Self-pay | Admitting: Family Medicine

## 2014-04-21 MED ORDER — PAROXETINE HCL 20 MG PO TABS: 20.0000 mg | ORAL_TABLET | Freq: Every day | ORAL | Status: DC

## 2014-04-21 MED ORDER — PRAVASTATIN SODIUM 20 MG PO TABS: 20.0000 mg | ORAL_TABLET | Freq: Every day | ORAL | Status: DC

## 2014-05-04 DIAGNOSIS — R109 Unspecified abdominal pain: Secondary | ICD-10-CM | POA: Diagnosis not present

## 2014-05-04 DIAGNOSIS — B1920 Unspecified viral hepatitis C without hepatic coma: Secondary | ICD-10-CM | POA: Diagnosis not present

## 2014-05-11 ENCOUNTER — Other Ambulatory Visit: Payer: Medicare Other

## 2014-05-11 DIAGNOSIS — B182 Chronic viral hepatitis C: Secondary | ICD-10-CM

## 2014-05-12 LAB — HEPATITIS C RNA QUANTITATIVE: HCV Quantitative: NOT DETECTED IU/mL (ref <15)

## 2014-05-13 ENCOUNTER — Telehealth: Payer: Self-pay | Admitting: Family Medicine

## 2014-05-13 NOTE — Telephone Encounter (Signed)
I spoke with patient, she has been coughing for few days productive of blood. I advised that she be seen today. She agreed to go to [facility] for assessment today.

## 2014-05-13 NOTE — Telephone Encounter (Signed)
FYI:  Just letting the PCP know that this patient called and explained that she has a horrible cough that makes her chest hurt and that she has coughed up blood. I offered to bring the patient in tomorrow as a same day so that she could be seen, she declined because she wanted to see only her PCP. I was able to get her in on Friday morning. / thanks Fonda Kinder, ASA

## 2014-05-15 ENCOUNTER — Encounter: Payer: Self-pay | Admitting: Family Medicine

## 2014-05-15 ENCOUNTER — Ambulatory Visit (INDEPENDENT_AMBULATORY_CARE_PROVIDER_SITE_OTHER): Payer: Medicare Other | Admitting: Family Medicine

## 2014-05-15 VITALS — BP 134/78 | HR 77 | Temp 98.0°F | Ht 67.5 in | Wt 315.0 lb

## 2014-05-15 DIAGNOSIS — R05 Cough: Secondary | ICD-10-CM | POA: Diagnosis not present

## 2014-05-15 DIAGNOSIS — E119 Type 2 diabetes mellitus without complications: Secondary | ICD-10-CM

## 2014-05-15 DIAGNOSIS — I10 Essential (primary) hypertension: Secondary | ICD-10-CM | POA: Diagnosis not present

## 2014-05-15 DIAGNOSIS — E669 Obesity, unspecified: Secondary | ICD-10-CM

## 2014-05-15 DIAGNOSIS — J449 Chronic obstructive pulmonary disease, unspecified: Secondary | ICD-10-CM | POA: Diagnosis not present

## 2014-05-15 DIAGNOSIS — K219 Gastro-esophageal reflux disease without esophagitis: Secondary | ICD-10-CM

## 2014-05-15 DIAGNOSIS — R059 Cough, unspecified: Secondary | ICD-10-CM

## 2014-05-15 DIAGNOSIS — E1169 Type 2 diabetes mellitus with other specified complication: Secondary | ICD-10-CM

## 2014-05-15 MED ORDER — AZITHROMYCIN 250 MG PO TABS: ORAL_TABLET | ORAL | Status: DC

## 2014-05-15 MED ORDER — PSEUDOEPHEDRINE-CODEINE-GG 30-10-100 MG/5ML PO SOLN: 10.0000 mL | Freq: Four times a day (QID) | ORAL | Status: DC | PRN

## 2014-05-15 MED ORDER — GUAIFENESIN-CODEINE 100-10 MG/5ML PO SYRP: 5.0000 mL | ORAL_SOLUTION | Freq: Three times a day (TID) | ORAL | Status: DC | PRN

## 2014-05-15 NOTE — Assessment & Plan Note (Signed)
DM well controlled. Continue current regimen. Including diet control for weight loss.

## 2014-05-15 NOTE — Progress Notes (Signed)
Subjective:     Patient ID: Kristina Hendricks, female   DOB: 11-04-1960, 54 y.o.   MRN: [id number]  HPI  Cough: Yellowish and greenish sputum for two week with blood stained on and off, last seen was yesterday, grand daughter was also coughing, she has fever at night and sweats a lot. She has difficulty breathing and she is also wheezing. There is associated chest pain with cough. She has been been using her albuterol more often. She also feels tight in her chest. GERD:Patient c/o epigastric burning which has been getting worse lately, she was recently seen by her GI who started her on Sulcralfate. She denies any blood in her stool, no change in bowel habit. She is schedule for EGD and colonoscopy next month. HTN:She has been taking her medications as instructed, she is here for follow up. DM:Her home glucose has been good. She ranges from 98-119. She is concern her Sucralfate might increase her glucose level.  Current Outpatient Prescriptions on File Prior to Visit  Medication Sig Dispense Refill  . diltiazem (TIAZAC) 240 MG 24 hr capsule Take 1 capsule (240 mg total) by mouth daily. 90 capsule 1  . glipiZIDE (GLUCOTROL) 5 MG tablet Take 0.5 tablets (2.5 mg total) by mouth daily before breakfast. 45 tablet 1  . insulin glargine (LANTUS) 100 UNIT/ML injection Inject 0.3 mLs (30 Units total) into the skin at bedtime. 10 mL 6  . PARoxetine (PAXIL) 20 MG tablet Take 1 tablet (20 mg total) by mouth daily. 30 tablet 3  . pravastatin (PRAVACHOL) 20 MG tablet Take 1 tablet (20 mg total) by mouth daily. 30 tablet 3  . sitaGLIPtin (JANUVIA) 50 MG tablet Take 1 tablet (50 mg total) by mouth daily. 90 tablet 1  . telmisartan-hydrochlorothiazide (MICARDIS HCT) 80-25 MG per tablet Take 1 tablet by mouth daily. 90 tablet 1  . albuterol (PROVENTIL HFA;VENTOLIN HFA) 108 (90 BASE) MCG/ACT inhaler Inhale 2 puffs into the lungs every 6 (six) hours as needed for wheezing or shortness of breath. (Patient not taking:  Reported on 05/15/2014) 18 g 4  . B-D ULTRAFINE III SHORT PEN 31G X 8 MM MISC   4  . esomeprazole (NEXIUM) 40 MG capsule Take 1 capsule (40 mg total) by mouth daily at 12 noon. (Patient not taking: Reported on 04/09/2014) 90 capsule 1  . Fluticasone-Salmeterol (ADVAIR) 500-50 MCG/DOSE AEPB Inhale 1 puff into the lungs 2 (two) times daily.    . Ledipasvir-Sofosbuvir (HARVONI) 90-400 MG TABS Take 1 tablet by mouth daily. (Patient not taking: Reported on 05/15/2014) 28 tablet 2  . traMADol (ULTRAM) 50 MG tablet Take 1 tablet (50 mg total) by mouth every 8 (eight) hours as needed. 90 tablet 1   No current facility-administered medications on file prior to visit.   Past Medical History  Diagnosis Date  . Arthritis   . Asthma   . COPD (chronic obstructive pulmonary disease)   . Diabetes mellitus without complication   . Hypertension   . Sleep apnea       Review of Systems  Constitutional: Negative.   Respiratory: Positive for cough, chest tightness, shortness of breath and wheezing.   Cardiovascular: Negative.   Gastrointestinal: Positive for abdominal pain. Negative for diarrhea and constipation.  Genitourinary: Negative.   All other systems reviewed and are negative.  Filed Vitals:   05/15/14 0915  BP: 134/78  Pulse: 77  Height: 5' 7.5" (1.715 m)  Weight: 315 lb (142.883 kg)  SpO2: 98%  Objective:   Physical Exam  Constitutional: She appears well-developed. No distress.  Cardiovascular: Normal rate, regular rhythm and normal heart sounds.   No murmur heard. Pulmonary/Chest: Effort normal and breath sounds normal. No tachypnea. No respiratory distress. She has no decreased breath sounds. She has no wheezes. She has no rhonchi. She has no rales.  Abdominal: Soft. Bowel sounds are normal. There is tenderness in the epigastric area. There is no rigidity, no rebound and no guarding. A hernia is present. Hernia confirmed positive in the ventral area.  Musculoskeletal: She  exhibits no edema.  Nursing note and vitals reviewed.      Assessment:     Cough:  GERD: HTN: DM:    Plan:     Check problem list.

## 2014-05-15 NOTE — Assessment & Plan Note (Signed)
Likely bronchitis vs CAP. She is started on Zithromax. Continue Albuterol prn and Advair for COPD as recommended. F/U soon if no improvement. We will consider C-Xray then. Cheratussin prn cough.

## 2014-05-15 NOTE — Assessment & Plan Note (Signed)
BP is optimally controlled. Continue current regimen.

## 2014-05-15 NOTE — Assessment & Plan Note (Signed)
EGD and colonoscopy scheduled. Continue Sucralfate. F/U prn.

## 2014-05-15 NOTE — Patient Instructions (Signed)
It was nice seeing you today, I am sorry about your cough. It could be that you are having infection on top of your COPD. I am glad you are stable. I will however have you take some antibiotics and cough medication. If no improvement or your symptom is getting worse we will need to get xray of your chest. Please call or go to the ED if symptoms worsens.

## 2014-05-28 ENCOUNTER — Ambulatory Visit: Payer: Medicare Other | Admitting: Internal Medicine

## 2014-05-28 ENCOUNTER — Other Ambulatory Visit (HOSPITAL_COMMUNITY): Payer: Self-pay | Admitting: Gastroenterology

## 2014-05-28 ENCOUNTER — Ambulatory Visit (INDEPENDENT_AMBULATORY_CARE_PROVIDER_SITE_OTHER): Payer: Medicare Other | Admitting: Internal Medicine

## 2014-05-28 ENCOUNTER — Encounter: Payer: Self-pay | Admitting: Internal Medicine

## 2014-05-28 VITALS — BP 127/90 | HR 76 | Temp 97.7°F | Ht 67.5 in | Wt 313.0 lb

## 2014-05-28 DIAGNOSIS — K7460 Unspecified cirrhosis of liver: Secondary | ICD-10-CM

## 2014-05-28 DIAGNOSIS — B182 Chronic viral hepatitis C: Secondary | ICD-10-CM

## 2014-05-28 DIAGNOSIS — Z09 Encounter for follow-up examination after completed treatment for conditions other than malignant neoplasm: Secondary | ICD-10-CM | POA: Diagnosis not present

## 2014-05-28 DIAGNOSIS — Z86010 Personal history of colonic polyps: Secondary | ICD-10-CM | POA: Diagnosis not present

## 2014-05-28 DIAGNOSIS — K317 Polyp of stomach and duodenum: Secondary | ICD-10-CM | POA: Diagnosis not present

## 2014-05-28 DIAGNOSIS — R1013 Epigastric pain: Secondary | ICD-10-CM | POA: Diagnosis not present

## 2014-05-28 DIAGNOSIS — D126 Benign neoplasm of colon, unspecified: Secondary | ICD-10-CM | POA: Diagnosis not present

## 2014-05-28 DIAGNOSIS — K6389 Other specified diseases of intestine: Secondary | ICD-10-CM | POA: Diagnosis not present

## 2014-05-28 DIAGNOSIS — Z8711 Personal history of peptic ulcer disease: Secondary | ICD-10-CM | POA: Diagnosis not present

## 2014-05-28 DIAGNOSIS — D123 Benign neoplasm of transverse colon: Secondary | ICD-10-CM | POA: Diagnosis not present

## 2014-05-28 NOTE — Assessment & Plan Note (Signed)
EGD today.  [place] every 6 months.

## 2014-05-28 NOTE — Assessment & Plan Note (Signed)
Doing great.  Remains undetectable.  RTC 4 months for SVR12.

## 2014-05-28 NOTE — Progress Notes (Signed)
   Subjective:    Patient ID: Kristina Hendricks, female    DOB: [birth year], 54 y.o.   MRN: [id number]  HPI She comes in for follow-up of hepatitis C. She has genotype 1A, viral load of 3 million and CT with noted cirrhosis of the liver. Has seen [facility].  She is hepatitis A and B non-immune. Now completed Harvoni and remains undetectable.     Review of Systems  Constitutional: Negative for fatigue.  Gastrointestinal: Negative for nausea and diarrhea.  Skin: Negative for rash.  Neurological: Negative for headaches.       Objective:   Physical Exam  Constitutional: She appears well-developed and well-nourished. No distress.  Eyes: No scleral icterus.  Cardiovascular: Normal rate, regular rhythm and normal heart sounds.   No murmur heard. Pulmonary/Chest: Effort normal and breath sounds normal. No respiratory distress.  Skin: No rash noted.          Assessment & Plan:

## 2014-05-29 ENCOUNTER — Ambulatory Visit (INDEPENDENT_AMBULATORY_CARE_PROVIDER_SITE_OTHER): Payer: Medicare Other | Admitting: Family Medicine

## 2014-05-29 ENCOUNTER — Encounter: Payer: Self-pay | Admitting: Family Medicine

## 2014-05-29 VITALS — BP 131/91 | HR 72 | Temp 98.1°F | Ht 67.5 in | Wt 314.6 lb

## 2014-05-29 DIAGNOSIS — I10 Essential (primary) hypertension: Secondary | ICD-10-CM | POA: Diagnosis not present

## 2014-05-29 DIAGNOSIS — E1169 Type 2 diabetes mellitus with other specified complication: Secondary | ICD-10-CM

## 2014-05-29 DIAGNOSIS — R05 Cough: Secondary | ICD-10-CM

## 2014-05-29 DIAGNOSIS — R059 Cough, unspecified: Secondary | ICD-10-CM

## 2014-05-29 DIAGNOSIS — K219 Gastro-esophageal reflux disease without esophagitis: Secondary | ICD-10-CM | POA: Diagnosis not present

## 2014-05-29 DIAGNOSIS — E119 Type 2 diabetes mellitus without complications: Secondary | ICD-10-CM | POA: Diagnosis not present

## 2014-05-29 DIAGNOSIS — E669 Obesity, unspecified: Secondary | ICD-10-CM

## 2014-05-29 NOTE — Assessment & Plan Note (Signed)
Rapidly resolving. No further evaluation required at this time.

## 2014-05-29 NOTE — Assessment & Plan Note (Signed)
I reviewed her preliminary EGD & Colonoscopy report she brought from her GI. Does not seem to have ulcer. ?? Gastritis. She does have multiple polyps which was biopsied. As discussed with patient since she recently had EGD this might have caused some GI irritation hence causing her pain. She is reassured this should improve in few days. Continue Nexium and Sucralfate in the mean time. F/U prn.

## 2014-05-29 NOTE — Patient Instructions (Signed)
Gastroesophageal Reflux Disease, Adult °Gastroesophageal reflux disease (GERD) happens when acid from your stomach goes into your food pipe (esophagus). The acid can cause a burning feeling in your chest. Over time, the acid can make small holes (ulcers) in your food pipe.  °HOME CARE °· Ask your doctor for advice about: °¨ Losing weight. °¨ Quitting smoking. °¨ Alcohol use. °· Avoid foods and drinks that make your problems worse. You may want to avoid: °¨ Caffeine and alcohol. °¨ Chocolate. °¨ Mints. °¨ Garlic and onions. °¨ Spicy foods. °¨ Citrus fruits, such as oranges, lemons, or limes. °¨ Foods that contain tomato, such as sauce, chili, salsa, and pizza. °¨ Fried and fatty foods. °· Avoid lying down for 3 hours before you go to bed or before you take a nap. °· Eat small meals often, instead of large meals. °· Wear loose-fitting clothing. Do not wear anything tight around your waist. °· Raise (elevate) the head of your bed 6 to 8 inches with wood blocks. Using extra pillows does not help. °· Only take medicines as told by your doctor. °· Do not take aspirin or ibuprofen. °GET HELP RIGHT AWAY IF:  °· You have pain in your arms, neck, jaw, teeth, or back. °· Your pain gets worse or changes. °· You feel sick to your stomach (nauseous), throw up (vomit), or sweat (diaphoresis). °· You feel short of breath, or you pass out (faint). °· Your throw up is green, yellow, black, or looks like coffee grounds or blood. °· Your poop (stool) is red, bloody, or black. °MAKE SURE YOU:  °· Understand these instructions. °· Will watch your condition. °· Will get help right away if you are not doing well or get worse. °Document Released: 06/28/2007 Document Revised: 04/03/2011 Document Reviewed: 07/29/2010 °[employer]® Patient Information ©2015 [employer]. This information is not intended to replace advice given to you by your health care provider. Make sure you discuss any questions you have with your health care provider. ° °

## 2014-05-29 NOTE — Assessment & Plan Note (Signed)
BP looks good. Continue current regimen. 

## 2014-05-29 NOTE — Assessment & Plan Note (Signed)
No acute change. Continue current regimen. F/U in 2-3 months for A1C

## 2014-05-29 NOTE — Progress Notes (Signed)
Subjective:     Patient ID: Kristina Hendricks, female   DOB: 1960-08-17, 54 y.o.   MRN: [id number]  HPI  Epigastric pain: Patient here for follow up after EGD yesterday, she stated her epigastric pain is worse after the procedure about 9/10 in severity, she takes her Nexium and Sucralfate. Denies any blood in her stool, feels well otherwise. HTN/DM: here for follow up, she has been compliant with her meds, denies any concern today. Cough: She feels much better with her cough, denies chest pain or SOB.  Current Outpatient Prescriptions on File Prior to Visit  Medication Sig Dispense Refill  . ADVAIR DISKUS 250-50 MCG/DOSE AEPB   5  . albuterol (PROVENTIL HFA;VENTOLIN HFA) 108 (90 BASE) MCG/ACT inhaler Inhale 2 puffs into the lungs every 6 (six) hours as needed for wheezing or shortness of breath. 18 g 4  . diltiazem (TIAZAC) 240 MG 24 hr capsule Take 1 capsule (240 mg total) by mouth daily. 90 capsule 1  . esomeprazole (NEXIUM) 40 MG capsule Take 1 capsule (40 mg total) by mouth daily at 12 noon. 90 capsule 1  . glipiZIDE (GLUCOTROL) 5 MG tablet Take 0.5 tablets (2.5 mg total) by mouth daily before breakfast. 45 tablet 1  . LANTUS SOLOSTAR 100 UNIT/ML Solostar Pen Inject 30 Units into the skin at bedtime.   6  . PARoxetine (PAXIL) 20 MG tablet Take 1 tablet (20 mg total) by mouth daily. 30 tablet 3  . pravastatin (PRAVACHOL) 20 MG tablet Take 1 tablet (20 mg total) by mouth daily. 30 tablet 3  . sitaGLIPtin (JANUVIA) 50 MG tablet Take 1 tablet (50 mg total) by mouth daily. 90 tablet 1  . sucralfate (CARAFATE) 1 G tablet   2  . telmisartan-hydrochlorothiazide (MICARDIS HCT) 80-25 MG per tablet Take 1 tablet by mouth daily. 90 tablet 1  . traMADol (ULTRAM) 50 MG tablet Take 1 tablet (50 mg total) by mouth every 8 (eight) hours as needed. 90 tablet 1  . B-D ULTRAFINE III SHORT PEN 31G X 8 MM MISC   4  . guaiFENesin-codeine (ROBITUSSIN AC) 100-10 MG/5ML syrup Take 5 mLs by mouth 3 (three) times daily  as needed for cough. (Patient not taking: Reported on 05/29/2014) 120 mL 0   No current facility-administered medications on file prior to visit.   Past Medical History  Diagnosis Date  . Arthritis   . Asthma   . COPD (chronic obstructive pulmonary disease)   . Diabetes mellitus without complication   . Hypertension   . Sleep apnea      Review of Systems  Respiratory: Negative.   Cardiovascular: Negative.   Gastrointestinal: Positive for abdominal pain. Negative for nausea, vomiting, diarrhea, constipation and blood in stool.  Genitourinary: Negative.   All other systems reviewed and are negative.  Filed Vitals:   05/29/14 0857  BP: 131/91  Pulse: 72  Temp: 98.1 F (36.7 C)  TempSrc: Oral  Height: 5' 7.5" (1.715 m)  Weight: 314 lb 9 oz (142.685 kg)        Objective:   Physical Exam  Constitutional: She is oriented to person, place, and time. She appears well-developed. No distress.  Cardiovascular: Normal rate, regular rhythm and normal heart sounds.   No murmur heard. Pulmonary/Chest: Effort normal and breath sounds normal. No respiratory distress. She has no wheezes.  Abdominal: Soft. Bowel sounds are normal. She exhibits no distension and no mass. There is tenderness.  Neurological: She is alert and oriented to person, place, and time.  Nursing note and vitals reviewed.      Assessment:     Epigastric pain: 9/10 HTN: DM2 Cough: Better    Plan:     Check problem list.

## 2014-05-31 ENCOUNTER — Emergency Department (HOSPITAL_COMMUNITY): Payer: Medicare Other

## 2014-05-31 ENCOUNTER — Emergency Department (HOSPITAL_COMMUNITY)
Admission: EM | Admit: 2014-05-31 | Discharge: 2014-05-31 | Disposition: A | Discharge status: Home / Self Care | Payer: Medicare Other | Attending: Emergency Medicine | Admitting: Emergency Medicine

## 2014-05-31 ENCOUNTER — Encounter (HOSPITAL_COMMUNITY): Payer: Self-pay | Admitting: Emergency Medicine

## 2014-05-31 DIAGNOSIS — Y9389 Activity, other specified: Secondary | ICD-10-CM | POA: Diagnosis not present

## 2014-05-31 DIAGNOSIS — Y998 Other external cause status: Secondary | ICD-10-CM | POA: Insufficient documentation

## 2014-05-31 DIAGNOSIS — I10 Essential (primary) hypertension: Secondary | ICD-10-CM | POA: Diagnosis not present

## 2014-05-31 DIAGNOSIS — M25511 Pain in right shoulder: Secondary | ICD-10-CM

## 2014-05-31 DIAGNOSIS — Z79899 Other long term (current) drug therapy: Secondary | ICD-10-CM | POA: Insufficient documentation

## 2014-05-31 DIAGNOSIS — Z87891 Personal history of nicotine dependence: Secondary | ICD-10-CM | POA: Diagnosis not present

## 2014-05-31 DIAGNOSIS — M545 Low back pain, unspecified: Secondary | ICD-10-CM

## 2014-05-31 DIAGNOSIS — Y92038 Other place in apartment as the place of occurrence of the external cause: Secondary | ICD-10-CM | POA: Diagnosis not present

## 2014-05-31 DIAGNOSIS — T148XXA Other injury of unspecified body region, initial encounter: Secondary | ICD-10-CM

## 2014-05-31 DIAGNOSIS — S40021A Contusion of right upper arm, initial encounter: Secondary | ICD-10-CM | POA: Insufficient documentation

## 2014-05-31 DIAGNOSIS — M1990 Unspecified osteoarthritis, unspecified site: Secondary | ICD-10-CM | POA: Insufficient documentation

## 2014-05-31 DIAGNOSIS — Z8669 Personal history of other diseases of the nervous system and sense organs: Secondary | ICD-10-CM | POA: Diagnosis not present

## 2014-05-31 DIAGNOSIS — E119 Type 2 diabetes mellitus without complications: Secondary | ICD-10-CM | POA: Diagnosis not present

## 2014-05-31 DIAGNOSIS — S59901A Unspecified injury of right elbow, initial encounter: Secondary | ICD-10-CM | POA: Insufficient documentation

## 2014-05-31 DIAGNOSIS — M25521 Pain in right elbow: Secondary | ICD-10-CM | POA: Diagnosis not present

## 2014-05-31 DIAGNOSIS — J449 Chronic obstructive pulmonary disease, unspecified: Secondary | ICD-10-CM | POA: Diagnosis not present

## 2014-05-31 DIAGNOSIS — S3992XA Unspecified injury of lower back, initial encounter: Secondary | ICD-10-CM | POA: Diagnosis not present

## 2014-05-31 DIAGNOSIS — W010XXA Fall on same level from slipping, tripping and stumbling without subsequent striking against object, initial encounter: Secondary | ICD-10-CM | POA: Insufficient documentation

## 2014-05-31 DIAGNOSIS — W19XXXA Unspecified fall, initial encounter: Secondary | ICD-10-CM

## 2014-05-31 DIAGNOSIS — S4991XA Unspecified injury of right shoulder and upper arm, initial encounter: Secondary | ICD-10-CM | POA: Diagnosis not present

## 2014-05-31 MED ORDER — HYDROCODONE-ACETAMINOPHEN 5-325 MG PO TABS
1.0000 | ORAL_TABLET | Freq: Once | ORAL | Status: AC
Start: 2014-05-31 — End: 2014-05-31
  Administered 2014-05-31: 1 via ORAL
  Filled 2014-05-31: qty 1

## 2014-05-31 MED ORDER — HYDROCODONE-ACETAMINOPHEN 5-325 MG PO TABS: 1.0000 | ORAL_TABLET | Freq: Four times a day (QID) | ORAL | Status: DC | PRN

## 2014-05-31 MED ORDER — NAPROXEN 500 MG PO TABS: 500.0000 mg | ORAL_TABLET | Freq: Two times a day (BID) | ORAL | Status: DC | PRN

## 2014-05-31 NOTE — ED Notes (Signed)
Pt c/o tripped over a stoop yesterday and fell onto right side. Pt c/o right shoulder pain and back pain. Pt has bruise to right upper arm. Pt denies + LOC.

## 2014-05-31 NOTE — Discharge Instructions (Signed)
Wear shoulder sling for no more than 3 days, then begin performing gentle range of motion exercises. Ice your shoulder throughout the day, using an ice pack for 20 minutes at a time every hour. Alternate between naprosyn and norco for pain relief. Do not drive or operate machinery with pain medication use. Call orthopedic follow up today or tomorrow to schedule followup appointment for recheck of ongoing shoulder pain in 1-2 weeks that can be canceled with a 24-48 hour notice if complete resolution of pain. Return to the ER for changes or worsening symptoms.    Shoulder Pain The shoulder is the joint that connects your arm to your body. Muscles and band-like tissues that connect bones to muscles (tendons) hold the joint together. Shoulder pain is felt if an injury or medical problem affects one or more parts of the shoulder. HOME CARE   Put ice on the sore area.  Put ice in a plastic bag.  Place a towel between your skin and the bag.  Leave the ice on for 15-20 minutes, 03-04 times a day for the first 2 days.  Stop using cold packs if they do not help with the pain.  If you were given something to keep your shoulder from moving (sling; shoulder immobilizer), wear it as told. Only take it off to shower or bathe.  Move your arm as little as possible, but keep your hand moving to prevent puffiness (swelling).  Squeeze a soft ball or foam pad as much as possible to help prevent swelling.  Take medicine as told by your doctor. GET HELP IF:  You have progressing new pain in your arm, hand, or fingers.  Your hand or fingers get cold.  Your medicine does not help lessen your pain. GET HELP RIGHT AWAY IF:   Your arm, hand, or fingers are numb or tingling.  Your arm, hand, or fingers are puffy (swollen), painful, or turn white or blue. MAKE SURE YOU:   Understand these instructions.  Will watch your condition.  Will get help right away if you are not doing well or get worse. Document  Released: 06/28/2007 Document Revised: 05/26/2013 Document Reviewed: 07/24/2011 [facility] Patient Information 2015 [place], [place]. This information is not intended to replace advice given to you by your health care provider. Make sure you discuss any questions you have with your health care provider.  Shoulder Range of Motion Exercises The shoulder is the most flexible joint in the human body. Because of this it is also the most unstable joint in the body. All ages can develop shoulder problems. Early treatment of problems is necessary for a good outcome. People react to shoulder pain by decreasing the movement of the joint. After a brief period of time, the shoulder can become "frozen". This is an almost complete loss of the ability to move the damaged shoulder. Following injuries your caregivers can give you instructions on exercises to keep your range of motion (ability to move your shoulder freely), or regain it if it has been lost.  [place]: Codman's Exercise or Pendulum Exercise  This exercise may be performed in a prone (face-down) lying position or standing while leaning on a chair with the opposite arm. Its purpose is to relax the muscles in your shoulder and slowly but surely increase the range of motion and to relieve pain.  Lie on your stomach close to the side edge of the bed. Let your weak arm hang over the edge  of the bed. Relax your shoulder, arm and hand. Let your shoulder blade relax and drop down.  Slowly and gently swing your arm forward and back. Do not use your neck muscles; relax them. It might be easier to have someone else gently start swinging your arm.  As pain decreases, increase your swing. To start, arm swing should begin at 15 degree angles. In time and as pain lessens, move to 30-45 degree angles. Start with swinging for about 15 seconds, and work towards swinging for 3 to 5 minutes.  This exercise may also be  performed in a standing/bent over position.  Stand and hold onto a sturdy chair with your good arm. Bend forward at the waist and bend your knees slightly to help protect your back. Relax your weak arm, let it hang limp. Relax your shoulder blade and let it drop.  Keep your shoulder relaxed and use body motion to swing your arm in small circles.  Stand up tall and relax.  Repeat motion and change direction of circles.  Start with swinging for about 30 seconds, and work towards swinging for 3 to 5 minutes. STRETCHING EXERCISES:  Lift your arm out in front of you with the elbow bent at 90 degrees. Using your other arm gently pull the elbow forward and across your body.  Bend one arm behind you with the palm facing outward. Using the other arm, hold a towel or rope and reach this arm up above your head, then bend it at the elbow to move your wrist to behind your neck. Grab the free end of the towel with the hand behind your back. Gently pull the towel up with the hand behind your neck, gradually increasing the pull on the hand behind the small of your back. Then, gradually pull down with the hand behind the small of your back. This will pull the hand and arm behind your neck further. Both shoulders will have an increased range of motion with repetition of this exercise. STRENGTHENING EXERCISES:  Standing with your arm at your side and straight out from your shoulder with the elbow bent at 90 degrees, hold onto a small weight and slowly raise your hand so it points straight up in the air. Repeat this five times to begin with, and gradually increase to ten times. Do this four times per day. As you grow stronger you can gradually increase the weight.  Repeat the above exercise, only this time using an elastic band. Start with your hand up in the air and pull down until your hand is by your side. As you grow stronger, gradually increase the amount you pull by increasing the number or size of the elastic  bands. Use the same amount of repetitions.  Standing with your hand at your side and holding onto a weight, gradually lift the hand in front of you until it is over your head. Do the same also with the hand remaining at your side and lift the hand away from your body until it is again over your head. Repeat this five times to begin with, and gradually increase to ten times. Do this four times per day. As you grow stronger you can gradually increase the weight. Document Released: 10/08/2002 Document Revised: 01/14/2013 Document Reviewed: 01/09/2005 [facility] Patient Information 2015 [place], [place]. This information is not intended to replace advice given to you by your health care provider. Make sure you discuss any questions you have with your health care provider.  Cryotherapy Cryotherapy is  when you put ice on your injury. Ice helps lessen pain and puffiness (swelling) after an injury. Ice works the best when you start using it in the first 24 to 48 hours after an injury. HOME CARE  Put a dry or damp towel between the ice pack and your skin.  You may press gently on the ice pack.  Leave the ice on for no more than 10 to 20 minutes at a time.  Check your skin after 5 minutes to make sure your skin is okay.  Rest at least 20 minutes between ice pack uses.  Stop using ice when your skin loses feeling (numbness).  Do not use ice on someone who cannot tell you when it hurts. This includes small children and people with memory problems (dementia). GET HELP RIGHT AWAY IF:  You have white spots on your skin.  Your skin turns blue or pale.  Your skin feels waxy or hard.  Your puffiness gets worse. MAKE SURE YOU:   Understand these instructions.  Will watch your condition.  Will get help right away if you are not doing well or get worse. Document Released: 06/28/2007 Document Revised: 04/03/2011 Document Reviewed: 09/01/2010 [facility] Patient Information 2015 [place], [place]. This  information is not intended to replace advice given to you by your health care provider. Make sure you discuss any questions you have with your health care provider.  Contusion A contusion is a deep bruise. Contusions are the result of an injury that caused bleeding under the skin. The contusion may turn blue, purple, or yellow. Minor injuries will give you a painless contusion, but more severe contusions may stay painful and swollen for a few weeks.  CAUSES  A contusion is usually caused by a blow, trauma, or direct force to an area of the body. SYMPTOMS   Swelling and redness of the injured area.  Bruising of the injured area.  Tenderness and soreness of the injured area.  Pain. DIAGNOSIS  The diagnosis can be made by taking a history and physical exam. An X-ray, CT scan, or MRI may be needed to determine if there were any associated injuries, such as fractures. TREATMENT  Specific treatment will depend on what area of the body was injured. In general, the best treatment for a contusion is resting, icing, elevating, and applying cold compresses to the injured area. Over-the-counter medicines may also be recommended for pain control. Ask your caregiver what the best treatment is for your contusion. HOME CARE INSTRUCTIONS   Put ice on the injured area.  Put ice in a plastic bag.  Place a towel between your skin and the bag.  Leave the ice on for 15-20 minutes, 3-4 times a day, or as directed by your health care provider.  Only take over-the-counter or prescription medicines for pain, discomfort, or fever as directed by your caregiver. Your caregiver may recommend avoiding anti-inflammatory medicines (aspirin, ibuprofen, and naproxen) for 48 hours because these medicines may increase bruising.  Rest the injured area.  If possible, elevate the injured area to reduce swelling. SEEK IMMEDIATE MEDICAL CARE IF:   You have increased bruising or swelling.  You have pain that is getting  worse.  Your swelling or pain is not relieved with medicines. MAKE SURE YOU:   Understand these instructions.  Will watch your condition.  Will get help right away if you are not doing well or get worse. Document Released: 10/19/2004 Document Revised: 01/14/2013 Document Reviewed: 11/14/2010 [facility] Patient Information 2015 [place], [place].  This information is not intended to replace advice given to you by your health care provider. Make sure you discuss any questions you have with your health care provider.  Back Pain, Adult Back pain is very common. The pain often gets better over time. The cause of back pain is usually not dangerous. Most people can learn to manage their back pain on their own.  HOME CARE   Stay active. Start with short walks on flat ground if you can. Try to walk farther each day.  Do not sit, drive, or stand in one place for more than 30 minutes. Do not stay in bed.  Do not avoid exercise or work. Activity can help your back heal faster.  Be careful when you bend or lift an object. Bend at your knees, keep the object close to you, and do not twist.  Sleep on a firm mattress. Lie on your side, and bend your knees. If you lie on your back, put a pillow under your knees.  Only take medicines as told by your doctor.  Put ice on the injured area.  Put ice in a plastic bag.  Place a towel between your skin and the bag.  Leave the ice on for 15-20 minutes, 03-04 times a day for the first 2 to 3 days. After that, you can switch between ice and heat packs.  Ask your doctor about back exercises or massage.  Avoid feeling anxious or stressed. Find good ways to deal with stress, such as exercise. GET HELP RIGHT AWAY IF:   Your pain does not go away with rest or medicine.  Your pain does not go away in 1 week.  You have new problems.  You do not feel well.  The pain spreads into your legs.  You cannot control when you poop (bowel movement) or pee  (urinate).  Your arms or legs feel weak or lose feeling (numbness).  You feel sick to your stomach (nauseous) or throw up (vomit).  You have belly (abdominal) pain.  You feel like you may pass out (faint). MAKE SURE YOU:   Understand these instructions.  Will watch your condition.  Will get help right away if you are not doing well or get worse. Document Released: 06/28/2007 Document Revised: 04/03/2011 Document Reviewed: 05/13/2013 [facility] Patient Information 2015 [place], [place]. This information is not intended to replace advice given to you by your health care provider. Make sure you discuss any questions you have with your health care provider.  Back Injury Prevention The following tips can help you to prevent a back injury. PHYSICAL FITNESS  Exercise often. Try to develop strong stomach (abdominal) muscles.  Do aerobic exercises often. This includes walking, jogging, biking, swimming.  Do exercises that help with balance and strength often. This includes tai chi and yoga.  Stretch before and after you exercise.  Keep a healthy weight. DIET   Ask your doctor how much calcium and vitamin D you need every day.  Include calcium in your diet. Foods high in calcium include dairy products; green, leafy vegetables; and products with calcium added (fortified).  Include vitamin D in your diet. Foods high in vitamin D include milk and products with vitamin D added.  Think about taking a multivitamin or other nutritional products called " supplements."  Stop smoking if you smoke. POSTURE   Sit and stand up straight. Avoid leaning forward or hunching over.  Choose chairs that support your lower back.  If you work at a desk:  Sit close to your work so you do not lean over.  Keep your chin tucked in.  Keep your neck drawn back.  Keep your elbows bent at a right angle. Your arms should look like the letter "L."  Sit high and close to the steering wheel when you  drive. Add low back support to your car seat if needed.  Avoid sitting or standing in one position for too long. Get up and move around every hour. Take breaks if you are driving for a long time.  Sleep on your side with your knees slightly bent. You can also sleep on your back with a pillow under your knees. Do not sleep on your stomach. LIFTING, TWISTING, AND REACHING  Avoid heavy lifting, especially lifting over and over again. If you must do heavy lifting:  Stretch before lifting.  Work slowly.  Rest between lifts.  Use carts and dollies to move objects when possible.  Make several small trips instead of carrying 1 heavy load.  Ask for help when you need it.  Ask for help when moving big, awkward objects.  Follow these steps when lifting:  Stand with your feet shoulder-width apart.  Get as close to the object as you can. Do not pick up heavy objects that are far from your body.  Use handles or lifting straps when possible.  Bend at your knees. Squat down, but keep your heels off the floor.  Keep your shoulders back, your chin tucked in, and your back straight.  Lift the object slowly. Tighten the muscles in your legs, stomach, and butt. Keep the object as close to the center of your body as possible.  Reverse these directions when you put a load down.  Do not:  Lift the object above your waist.  Twist at the waist while lifting or carrying a load. Move your feet if you need to turn, not your waist.  Bend over without bending at your knees.  Avoid reaching over your head, across a table, or for an object on a high surface. OTHER TIPS  Avoid wet floors and keep sidewalks clear of ice.  Do not sleep on a mattress that is too soft or too hard.  Keep items that you use often within easy reach.  Put heavier objects on shelves at waist level. Put lighter objects on lower or higher shelves.  Find ways to lessen your stress. You can try exercise, massage, or  relaxation.  Get help for depression or anxiety if needed. GET HELP IF:  You injure your back.  You have questions about diet, exercise, or other ways to prevent back injuries. MAKE SURE YOU:  Understand these instructions.  Will watch your condition.  Will get help right away if you are not doing well or get worse. Document Released: 06/28/2007 Document Revised: 04/03/2011 Document Reviewed: 02/20/2011 [facility] Patient Information 2015 [place], [place]. This information is not intended to replace advice given to you by your health care provider. Make sure you discuss any questions you have with your health care provider.  Back Exercises Back exercises help treat and prevent back injuries. The goal of back exercises is to increase the strength of your abdominal and back muscles and the flexibility of your back. These exercises should be started when you no longer have back pain. Back exercises include:  Pelvic Tilt. Lie on your back with your knees bent. Tilt your pelvis until the lower part of your back is against the floor. Hold this position 5  to 10 sec and repeat 5 to 10 times.  Knee to Chest. Pull first 1 knee up against your chest and hold for 20 to 30 seconds, repeat this with the other knee, and then both knees. This may be done with the other leg straight or bent, whichever feels better.  Sit-Ups or Curl-Ups. Bend your knees 90 degrees. Start with tilting your pelvis, and do a partial, slow sit-up, lifting your trunk only 30 to 45 degrees off the floor. Take at least 2 to 3 seconds for each sit-up. Do not do sit-ups with your knees out straight. If partial sit-ups are difficult, simply do the above but with only tightening your abdominal muscles and holding it as directed.  Hip-Lift. Lie on your back with your knees flexed 90 degrees. Push down with your feet and shoulders as you raise your hips a couple inches off the floor; hold for 10 seconds, repeat 5 to 10 times.  Back  arches. Lie on your stomach, propping yourself up on bent elbows. Slowly press on your hands, causing an arch in your low back. Repeat 3 to 5 times. Any initial stiffness and discomfort should lessen with repetition over time.  Shoulder-Lifts. Lie face down with arms beside your body. Keep hips and torso pressed to floor as you slowly lift your head and shoulders off the floor. Do not overdo your exercises, especially in the beginning. Exercises may cause you some mild back discomfort which lasts for a few minutes; however, if the pain is more severe, or lasts for more than 15 minutes, do not continue exercises until you see your caregiver. Improvement with exercise therapy for back problems is slow.  See your caregivers for assistance with developing a proper back exercise program. Document Released: 02/17/2004 Document Revised: 04/03/2011 Document Reviewed: 11/10/2010 [facility] Patient Information 2015 [place], [place]. This information is not intended to replace advice given to you by your health care provider. Make sure you discuss any questions you have with your health care provider.

## 2014-05-31 NOTE — ED Provider Notes (Signed)
CSN: [id number]     Arrival date & time 05/31/14  7673 History   First MD Initiated Contact with Patient 05/31/14 0831     Chief Complaint  Patient presents with  . Fall  . Shoulder Pain  . Back Pain     (Consider location/radiation/quality/duration/timing/severity/associated sxs/prior Treatment) HPI Comments: Kristina Hendricks is a 54 y.o. female with a PMHx of arthritis, asthma/COPD, DM2, HTN, and sleep apnea, who presents to the ED with complaints of mechanical fall after tripping over a stoop in her apartment complex at around 2:30 PM causing her to fall onto her right shoulder. She is currently complaining of 10/10 constant aching right shoulder pain that radiates all the way down her arm, worse with making a fist and any arm movement, and unrelieved with her home tramadol. She developed a large bruise to the inside of her right upper arm. She is also endorsing slight right hand tingling and right lower back pain after the fall. She denies any chest pain or shortness of breath, head injury or loss of consciousness, abdominal pain, nausea, vomiting, dysuria, hematuria, flank pain, numbness, weakness, or cauda equina symptoms. She denies any cuts or abrasions. She had to have her daughter put her shirt on her this morning because she cannot move her shoulder in any direction.  Patient is a 54 y.o. female presenting with fall, shoulder pain, and back pain. The history is provided by the patient. No language interpreter was used.  Fall This is a new problem. The current episode started yesterday. The problem occurs rarely. The problem has been unchanged. Associated symptoms include arthralgias (R shoulder and elbow) and myalgias (R arm). Pertinent negatives include no chest pain, headaches, joint swelling, nausea, neck pain, numbness, vomiting or weakness. Exacerbated by: movement. She has tried oral narcotics for the symptoms. The treatment provided no relief.  Shoulder Pain Associated symptoms:  back pain   Associated symptoms: no neck pain   Back Pain Associated symptoms: no chest pain, no dysuria, no headaches, no numbness and no weakness     Past Medical History  Diagnosis Date  . Arthritis   . Asthma   . COPD (chronic obstructive pulmonary disease)   . Diabetes mellitus without complication   . Hypertension   . Sleep apnea    Past Surgical History  Procedure Laterality Date  . Cholecystectomy  2003  . Tubal ligation  1984   Family History  Problem Relation Age of Onset  . Alcohol abuse Mother   . Drug abuse Mother   . Cancer Mother   . Depression Mother   . Diabetes Mother   . Hypertension Mother   . Heart disease Father   . Depression Father   . Diabetes Father   . Hypertension Father    History  Substance Use Topics  . Smoking status: Former Smoker    Quit date: 10/24/2011  . Smokeless tobacco: Never Used  . Alcohol Use: No   OB History    No data available     Review of Systems  HENT: Negative for facial swelling (no head injury).   Respiratory: Negative for shortness of breath.   Cardiovascular: Negative for chest pain.  Gastrointestinal: Negative for nausea and vomiting.  Genitourinary: Negative for dysuria, hematuria and flank pain.  Musculoskeletal: Positive for myalgias (R arm), back pain and arthralgias (R shoulder and elbow). Negative for joint swelling and neck pain.  Skin: Positive for color change (bruising). Negative for wound.  Allergic/Immunologic: Positive for immunocompromised state (diabetic).  Neurological: Negative for dizziness, weakness, light-headedness, numbness and headaches.  Psychiatric/Behavioral: Negative for confusion.   10 Systems reviewed and are negative for acute change except as noted in the HPI.    Allergies  Review of patient's allergies indicates no known allergies.  Home Medications   Prior to Admission medications   Medication Sig Start Date End Date Taking? Authorizing Provider  ADVAIR DISKUS  250-50 MCG/DOSE AEPB  04/28/14   Historical Provider, MD  albuterol (PROVENTIL HFA;VENTOLIN HFA) 108 (90 BASE) MCG/ACT inhaler Inhale 2 puffs into the lungs every 6 (six) hours as needed for wheezing or shortness of breath. 01/02/14   Kinnie Feil, MD  B-D ULTRAFINE III SHORT PEN 31G X 8 MM MISC  03/01/14   Historical Provider, MD  diltiazem (TIAZAC) 240 MG 24 hr capsule Take 1 capsule (240 mg total) by mouth daily. 01/02/14   Kinnie Feil, MD  esomeprazole (NEXIUM) 40 MG capsule Take 1 capsule (40 mg total) by mouth daily at 12 noon. 01/02/14   Kinnie Feil, MD  glipiZIDE (GLUCOTROL) 5 MG tablet Take 0.5 tablets (2.5 mg total) by mouth daily before breakfast. 01/02/14   Kinnie Feil, MD  guaiFENesin-codeine (ROBITUSSIN AC) 100-10 MG/5ML syrup Take 5 mLs by mouth 3 (three) times daily as needed for cough. Patient not taking: Reported on 05/29/2014 05/15/14   Kinnie Feil, MD  LANTUS SOLOSTAR 100 UNIT/ML Solostar Pen Inject 30 Units into the skin at bedtime.  04/28/14   Historical Provider, MD  PARoxetine (PAXIL) 20 MG tablet Take 1 tablet (20 mg total) by mouth daily. 04/21/14   Kinnie Feil, MD  pravastatin (PRAVACHOL) 20 MG tablet Take 1 tablet (20 mg total) by mouth daily. 04/21/14   Kinnie Feil, MD  sitaGLIPtin (JANUVIA) 50 MG tablet Take 1 tablet (50 mg total) by mouth daily. 01/02/14   Kinnie Feil, MD  sucralfate (CARAFATE) 1 G tablet  05/04/14   Historical Provider, MD  telmisartan-hydrochlorothiazide (MICARDIS HCT) 80-25 MG per tablet Take 1 tablet by mouth daily. 01/02/14   Kinnie Feil, MD  traMADol (ULTRAM) 50 MG tablet Take 1 tablet (50 mg total) by mouth every 8 (eight) hours as needed. 04/07/14   Kinnie Feil, MD   BP 153/91 mmHg  Pulse 68  Temp(Src) 98.3 F (36.8 C) (Oral)  Resp 18  Ht 5' 7.5" (1.715 m)  Wt 314 lb 9 oz (142.685 kg)  BMI 48.51 kg/m2  SpO2 95%  LMP 05/04/2014 Physical Exam  Constitutional: She is oriented to person, place, and  time. Vital signs are normal. She appears well-developed and well-nourished.  Non-toxic appearance. No distress.  Afebrile, nontoxic, NAD  HENT:  Head: Normocephalic and atraumatic.  Mouth/Throat: Mucous membranes are normal.  Eyes: Conjunctivae and EOM are normal. Right eye exhibits no discharge. Left eye exhibits no discharge.  Neck: Normal range of motion. Neck supple.  Cardiovascular: Normal rate and intact distal pulses.   Pulmonary/Chest: Effort normal. No respiratory distress.  Abdominal: Normal appearance. She exhibits no distension.  Musculoskeletal:       Right shoulder: She exhibits decreased range of motion (due to pain), tenderness, bony tenderness, deformity (internally rotated and adducted, somewhat downward sloped shoulder), spasm and decreased strength (due to pain). She exhibits no crepitus, no laceration and normal pulse.       Right elbow: She exhibits normal range of motion, no swelling and no deformity. Tenderness found. Olecranon process tenderness noted.       Lumbar  back: She exhibits tenderness and bony tenderness. She exhibits normal range of motion, no deformity and no spasm.       Back:       Arms: R shoulder with limited ROM due to pain, diffuse bony TTP at humeral head, with ?posterior defect palpable but body habitus limits exam somewhat so difficult to fully assess, pt holding arm in internal rotation and adduction and shoulder sloping slightly downward, will not allow for any PROM due to pain, which secondarily limits shoulder strength exam but distal muscle strength intact. Large hematoma noted to internal aspect of proximal upper arm. +Trapezius muscular TTP and spasms, no obvious swelling/effusion but body habitus limits exam, no crepitus but poorly examined due to pt discomfort with ROM, unable to perform apley scratch, resisted int/ext rotation, or empty can testing. R elbow with FROM, no swelling or deformities, with some TTP over olecranon process. Distal  strength and sensation grossly intact in all extremities, distal pulses intact.   Lumbar spine with FROM intact with diffuse midline and R sided TTP, no bony stepoffs or deformities, no muscle spasms although body habitus limits exam. Strength 5/5 in all extremities, sensation grossly intact in all extremities, negative SLR bilaterally, gait steady and nonantalgic. No overlying skin changes.   Neurological: She is alert and oriented to person, place, and time. She has normal strength. No sensory deficit.  Skin: Skin is warm, dry and intact. Bruising noted. No abrasion, no laceration and no rash noted.  Skin intact Large bruise to internal R upper arm  Psychiatric: She has a normal mood and affect. Her behavior is normal.  Nursing note and vitals reviewed.   ED Course  Procedures (including critical care time) Labs Review Labs Reviewed - No data to display  Imaging Review Dg Lumbar Spine Complete  05/31/2014   CLINICAL DATA:  Fall today.  Acute lumbago/low back pain  EXAM: LUMBAR SPINE - COMPLETE 4+ VIEW  COMPARISON:  CT 12/05/2013.  FINDINGS: There is no evidence of lumbar spine fracture. Alignment is normal. Cholecystectomy clips are present in the right upper quadrant. Mild degenerative disc disease at L1-L2 with anterior osteophytes. Otherwise the discs appear normal.  IMPRESSION: No acute abnormality.  Mild lumbar spondylosis.   Electronically Signed   By: Dereck Ligas M.D.   On: 05/31/2014 09:44   Dg Shoulder Right  05/31/2014   CLINICAL DATA:  Fall.  RIGHT shoulder pain.  Initial encounter.  EXAM: RIGHT SHOULDER - 2+ VIEW  COMPARISON:  None.  FINDINGS: RIGHT glenohumeral joint is located. No axillary view is submitted for interpretation. 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 Study degraded by obesity.  IMPRESSION: No acute abnormality.   Electronically Signed   By: Dereck Ligas M.D.   On: 05/31/2014 09:46   Dg Elbow Complete Right  05/31/2014   CLINICAL DATA:  Fall.  RIGHT  elbow pain.  Initial encounter.  EXAM: RIGHT ELBOW - COMPLETE 3+ VIEW  COMPARISON:  None.  FINDINGS: Anatomic alignment of the RIGHT elbow. No effusion. No fracture. Insertional triceps enthesopathy and mild osteoarthritis of the elbow.  IMPRESSION: No acute abnormality.  Mild osteoarthritis of the RIGHT elbow.   Electronically Signed   By: Dereck Ligas M.D.   On: 05/31/2014 09:47   Dg Humerus Right  05/31/2014   CLINICAL DATA:  Fall.  RIGHT shoulder, arm and elbow pain.  EXAM: RIGHT HUMERUS - 2+ VIEW  COMPARISON:  None.  FINDINGS: The humerus is intact. Artifact is present from overlapping soft  tissues and clothing. Study degraded by obesity.  IMPRESSION: Negative.   Electronically Signed   By: Dereck Ligas M.D.   On: 05/31/2014 09:48     EKG Interpretation None      MDM   Final diagnoses:  Fall  Right-sided low back pain without sciatica  Right shoulder pain  Right elbow pain  Contusion    54 y.o. female here with mechanical fall yesterday resulting in right shoulder pain and right-sided back pain. Patient is holding shoulder internally rotated and adducted with slight deformity noted, possible defect posteriorly although body habitus inhibits full examination. Pt will not allow for any ROM of arm/shoulder. Concerning for shoulder dislocation. Large hematoma over proximal internal humerus. Will obtain imaging. Pt also with midline lumbar back tenderness, will obtain imaging. Neurovascularly intact in all extremities. Will give pain control and reassess.   10:03 AM Xrays negative. Pt now moving shoulder much more after pain control, able to fully abduct. Discussed RICE therapy, will give sling for comfort but discussed ROM exercises in 3 days to avoid adhesive capsulitis. Will give pain meds. Will have her f/up with ortho in 1-2 wks. I explained the diagnosis and have given explicit precautions to return to the ER including for any other new or worsening symptoms. The patient  understands and accepts the medical plan as it's been dictated and I have answered their questions. Discharge instructions concerning home care and prescriptions have been given. The patient is STABLE and is discharged to home in good condition.  BP 133/70 mmHg  Pulse 68  Temp(Src) 98.1 F (36.7 C) (Oral)  Resp 16  Ht 5' 7.5" (1.715 m)  Wt 314 lb 9 oz (142.685 kg)  BMI 48.51 kg/m2  SpO2 100%  LMP 05/04/2014  Meds ordered this encounter  Medications  . HYDROcodone-acetaminophen (NORCO/VICODIN) 5-325 MG per tablet 1 tablet    Sig:   . HYDROcodone-acetaminophen (NORCO) 5-325 MG per tablet    Sig: Take 1 tablet by mouth every 6 (six) hours as needed for severe pain.    Dispense:  10 tablet    Refill:  0    Order Specific Question:  Supervising Provider    Answer:  Sabra Heck, BRIAN [[id number]]  . naproxen (NAPROSYN) 500 MG tablet    Sig: Take 1 tablet (500 mg total) by mouth 2 (two) times daily as needed for mild pain, moderate pain or headache (TAKE WITH MEALS.).    Dispense:  20 tablet    Refill:  0    Order Specific Question:  Supervising Provider    Answer:  Noemi Chapel [id number] Haubner Camprubi-Soms, PA-C 05/31/14 [place], MD 06/04/14 [phone]

## 2014-06-25 ENCOUNTER — Other Ambulatory Visit: Payer: Self-pay | Admitting: Family Medicine

## 2014-07-13 ENCOUNTER — Encounter: Payer: Self-pay | Admitting: Family Medicine

## 2014-07-14 ENCOUNTER — Ambulatory Visit (INDEPENDENT_AMBULATORY_CARE_PROVIDER_SITE_OTHER): Payer: Medicare Other | Admitting: Family Medicine

## 2014-07-14 ENCOUNTER — Encounter: Payer: Self-pay | Admitting: Family Medicine

## 2014-07-14 VITALS — BP 142/80 | HR 74 | Temp 97.6°F | Ht 68.0 in | Wt 307.0 lb

## 2014-07-14 DIAGNOSIS — E669 Obesity, unspecified: Secondary | ICD-10-CM

## 2014-07-14 DIAGNOSIS — I10 Essential (primary) hypertension: Secondary | ICD-10-CM

## 2014-07-14 DIAGNOSIS — E119 Type 2 diabetes mellitus without complications: Secondary | ICD-10-CM | POA: Diagnosis not present

## 2014-07-14 DIAGNOSIS — E1169 Type 2 diabetes mellitus with other specified complication: Secondary | ICD-10-CM

## 2014-07-14 DIAGNOSIS — R609 Edema, unspecified: Secondary | ICD-10-CM

## 2014-07-14 DIAGNOSIS — N926 Irregular menstruation, unspecified: Secondary | ICD-10-CM | POA: Diagnosis not present

## 2014-07-14 LAB — POCT GLYCOSYLATED HEMOGLOBIN (HGB A1C): HEMOGLOBIN A1C: 6

## 2014-07-14 MED ORDER — ESOMEPRAZOLE MAGNESIUM 40 MG PO CPDR: 40.0000 mg | DELAYED_RELEASE_CAPSULE | Freq: Every day | ORAL | Status: DC

## 2014-07-14 MED ORDER — DILTIAZEM HCL ER BEADS 240 MG PO CP24: 240.0000 mg | ORAL_CAPSULE | Freq: Every day | ORAL | Status: DC

## 2014-07-14 MED ORDER — SITAGLIPTIN PHOSPHATE 50 MG PO TABS: 50.0000 mg | ORAL_TABLET | Freq: Every day | ORAL | Status: DC

## 2014-07-14 MED ORDER — GLIPIZIDE 5 MG PO TABS: 2.5000 mg | ORAL_TABLET | Freq: Every day | ORAL | Status: DC

## 2014-07-14 MED ORDER — PAROXETINE HCL 20 MG PO TABS: 20.0000 mg | ORAL_TABLET | Freq: Every day | ORAL | Status: DC

## 2014-07-14 MED ORDER — TELMISARTAN-HCTZ 80-25 MG PO TABS: 1.0000 | ORAL_TABLET | Freq: Every day | ORAL | Status: DC

## 2014-07-14 MED ORDER — PRAVASTATIN SODIUM 20 MG PO TABS: 20.0000 mg | ORAL_TABLET | Freq: Every day | ORAL | Status: DC

## 2014-07-14 NOTE — Assessment & Plan Note (Signed)
Patient stopped having her period shortly after starting Harvoni sometimes in March. She has now been off [place]. It could be that she is now getting back on her normal cycle after discontinuing Harvoni or she is actually having premenopausal syndrome. Plan to watch for the next few months, if she continues to have this problem, I will obtain a pelvic U/S. She agreed with plan.

## 2014-07-14 NOTE — Assessment & Plan Note (Signed)
Patient is compliant with her medications. A1C checked today looks good. Continue current DM regimen including diet and exercise.

## 2014-07-14 NOTE — Assessment & Plan Note (Signed)
Patient lost 7 lbs since her last visit. She was commended on this. She will continue to exercise and control her diet. F/U in 3 months.

## 2014-07-14 NOTE — Assessment & Plan Note (Signed)
Of her hand and ankle. Currently asymptomatic. I advised avoiding salt containing diet. We will monitor for now.

## 2014-07-14 NOTE — Assessment & Plan Note (Signed)
BP is stable on current regimen.

## 2014-07-14 NOTE — Progress Notes (Signed)
Subjective:     Patient ID: Kristina Hendricks, female   DOB: September 22, 1960, 54 y.o.   MRN: [id number]  HPI  DM2:Patient is compliant with her meds, her home CBG has been good with highest of 150 and lowest of 90s, denies hypoglycemic episode. HTN:She is compliant with her meds, here for follow up. Irreg Period:C/O Irregular periods since April of 2016. She normally gets her periods every month lasting 4-5 days till April of 2016 when she suddenly stopped having her menses. Few weeks ago, she had her period again for 1 1/2 days, this was light period compared to her regular flow. She felt cramping at the time which is normal with her periods. Since then she has not had any other bleeding. Her last sexual activity was 4 months ago, she had tubal ligation many years ago. Hand/Ankle swelling:C/O hand and ankle swelling whenever she wakes up in the morning, this last few min and resolves. She denies any pain or erythema of her hands or ankle. Obesity: She is working hard on losing weight. She has started walking almost everyday and is watching her diet as well.  Current Outpatient Prescriptions on File Prior to Visit  Medication Sig Dispense Refill  . ADVAIR DISKUS 250-50 MCG/DOSE AEPB   5  . albuterol (PROVENTIL HFA;VENTOLIN HFA) 108 (90 BASE) MCG/ACT inhaler Inhale 2 puffs into the lungs every 6 (six) hours as needed for wheezing or shortness of breath. 18 g 4  . diltiazem (TIAZAC) 240 MG 24 hr capsule Take 1 capsule (240 mg total) by mouth daily. 90 capsule 1  . esomeprazole (NEXIUM) 40 MG capsule Take 1 capsule (40 mg total) by mouth daily at 12 noon. 90 capsule 1  . glipiZIDE (GLUCOTROL) 5 MG tablet Take 0.5 tablets (2.5 mg total) by mouth daily before breakfast. 45 tablet 1  . LANTUS SOLOSTAR 100 UNIT/ML Solostar Pen Inject 30 Units into the skin at bedtime.   6  . PARoxetine (PAXIL) 20 MG tablet Take 1 tablet (20 mg total) by mouth daily. 30 tablet 3  . pravastatin (PRAVACHOL) 20 MG tablet Take 1  tablet (20 mg total) by mouth daily. 30 tablet 3  . sitaGLIPtin (JANUVIA) 50 MG tablet Take 1 tablet (50 mg total) by mouth daily. 90 tablet 1  . telmisartan-hydrochlorothiazide (MICARDIS HCT) 80-25 MG per tablet Take 1 tablet by mouth daily. 90 tablet 1  . traMADol (ULTRAM) 50 MG tablet TAKE 1 TABLET BY MOUTH EVERY 8 HOURS AS NEEDED 90 tablet 1  . B-D ULTRAFINE III SHORT PEN 31G X 8 MM MISC   4  . naproxen (NAPROSYN) 500 MG tablet Take 1 tablet (500 mg total) by mouth 2 (two) times daily as needed for mild pain, moderate pain or headache (TAKE WITH MEALS.). (Patient not taking: Reported on 07/14/2014) 20 tablet 0  . sucralfate (CARAFATE) 1 G tablet   2   No current facility-administered medications on file prior to visit.   Past Medical History  Diagnosis Date  . Arthritis   . Asthma   . COPD (chronic obstructive pulmonary disease)   . Diabetes mellitus without complication   . Hypertension   . Sleep apnea        Review of Systems  Respiratory: Negative.   Cardiovascular: Negative.   Gastrointestinal: Negative for abdominal pain, diarrhea and constipation.  Genitourinary: Positive for menstrual problem. Negative for vaginal discharge.  All other systems reviewed and are negative.      Filed Vitals:   07/14/14 2542  BP: 142/80  Pulse: 74  Temp: 97.6 F (36.4 C)  TempSrc: Oral  Height: 5\' 8"  (1.727 m)  Weight: 307 lb (139.254 kg)    Objective:   Physical Exam  Constitutional: She appears well-developed.  Cardiovascular: Normal rate, regular rhythm, normal heart sounds and intact distal pulses.   No murmur heard. Pulmonary/Chest: Effort normal and breath sounds normal. No respiratory distress. She has no wheezes.  Abdominal: Soft. Bowel sounds are normal. She exhibits no mass. There is no tenderness. There is no rebound and no guarding.  Genitourinary:  Deferred.  Musculoskeletal: She exhibits no edema or tenderness.  Neurological: She is alert.  Nursing note and  vitals reviewed.      Assessment:     DM2: HTN: Irreg Period: Hand/Ankle swelling: Obesity    Plan:     Check problem list.

## 2014-07-14 NOTE — Patient Instructions (Signed)
Menopause Menopause is the normal time of life when menstrual periods stop completely. Menopause is complete when you have missed 12 consecutive menstrual periods. It usually occurs between the ages of 48 years and 55 years. Very rarely does a woman develop menopause before the age of 40 years. At menopause, your ovaries stop producing the female hormones estrogen and progesterone. This can cause undesirable symptoms and also affect your health. Sometimes the symptoms may occur 4-5 years before the menopause begins. There is no relationship between menopause and:  Oral contraceptives.  Number of children you had.  Race.  The age your menstrual periods started (menarche). Heavy smokers and very thin women may develop menopause earlier in life. CAUSES  The ovaries stop producing the female hormones estrogen and progesterone.  Other causes include:  Surgery to remove both ovaries.  The ovaries stop functioning for no known reason.  Tumors of the pituitary gland in the brain.  Medical disease that affects the ovaries and hormone production.  Radiation treatment to the abdomen or pelvis.  Chemotherapy that affects the ovaries. SYMPTOMS   Hot flashes.  Night sweats.  Decrease in sex drive.  Vaginal dryness and thinning of the vagina causing painful intercourse.  Dryness of the skin and developing wrinkles.  Headaches.  Tiredness.  Irritability.  Memory problems.  Weight gain.  Bladder infections.  Hair growth of the face and chest.  Infertility. More serious symptoms include:  Loss of bone (osteoporosis) causing breaks (fractures).  Depression.  Hardening and narrowing of the arteries (atherosclerosis) causing heart attacks and strokes. DIAGNOSIS   When the menstrual periods have stopped for 12 straight months.  Physical exam.  Hormone studies of the blood. TREATMENT  There are many treatment choices and nearly as many questions about them. The  decisions to treat or not to treat menopausal changes is an individual choice made with your health care provider. Your health care provider can discuss the treatments with you. Together, you can decide which treatment will work best for you. Your treatment choices may include:   Hormone therapy (estrogen and progesterone).  Non-hormonal medicines.  Treating the individual symptoms with medicine (for example antidepressants for depression).  Herbal medicines that may help specific symptoms.  Counseling by a psychiatrist or psychologist.  Group therapy.  Lifestyle changes including:  Eating healthy.  Regular exercise.  Limiting caffeine and alcohol.  Stress management and meditation.  No treatment. HOME CARE INSTRUCTIONS   Take the medicine your health care provider gives you as directed.  Get plenty of sleep and rest.  Exercise regularly.  Eat a diet that contains calcium (good for the bones) and soy products (acts like estrogen hormone).  Avoid alcoholic beverages.  Do not smoke.  If you have hot flashes, dress in layers.  Take supplements, calcium, and vitamin D to strengthen bones.  You can use over-the-counter lubricants or moisturizers for vaginal dryness.  Group therapy is sometimes very helpful.  Acupuncture may be helpful in some cases. SEEK MEDICAL CARE IF:   You are not sure you are in menopause.  You are having menopausal symptoms and need advice and treatment.  You are still having menstrual periods after age 55 years.  You have pain with intercourse.  Menopause is complete (no menstrual period for 12 months) and you develop vaginal bleeding.  You need a referral to a specialist (gynecologist, psychiatrist, or psychologist) for treatment. SEEK IMMEDIATE MEDICAL CARE IF:   You have severe depression.  You have excessive vaginal bleeding.    You fell and think you have a broken bone.  You have pain when you urinate.  You develop leg or  chest pain.  You have a fast pounding heart beat (palpitations).  You have severe headaches.  You develop vision problems.  You feel a lump in your breast.  You have abdominal pain or severe indigestion. Document Released: 04/01/2003 Document Revised: 09/11/2012 Document Reviewed: 08/08/2012 Exit[employer] Patient Information 2015 [employer]. This information is not intended to replace advice given to you by your health care provider. Make sure you discuss any questions you have with your health care provider.  

## 2014-07-28 ENCOUNTER — Other Ambulatory Visit: Payer: Self-pay | Admitting: Family Medicine

## 2014-07-28 NOTE — Telephone Encounter (Signed)
Kristina Hendricks think she may have misplaced her rxs she received on the 21st of last month.  She has moved and do not remember where any of the documents given after appt.  She is requesting that her rxs for glipizide, emeprazole ,diltizem, sitagliptin,and telmisartan-hctz be resent to a different pharmacy where she now lives.  Send to [facility] on [address] in [place]

## 2014-07-29 ENCOUNTER — Telehealth: Payer: Self-pay | Admitting: Family Medicine

## 2014-07-29 ENCOUNTER — Other Ambulatory Visit: Payer: Self-pay | Admitting: Family Medicine

## 2014-07-29 MED ORDER — PRAVASTATIN SODIUM 20 MG PO TABS: 20.0000 mg | ORAL_TABLET | Freq: Every day | ORAL | Status: DC

## 2014-07-29 MED ORDER — SITAGLIPTIN PHOSPHATE 50 MG PO TABS: 50.0000 mg | ORAL_TABLET | Freq: Every day | ORAL | Status: DC

## 2014-07-29 MED ORDER — PAROXETINE HCL 20 MG PO TABS: 20.0000 mg | ORAL_TABLET | Freq: Every day | ORAL | Status: DC

## 2014-07-29 MED ORDER — GLIPIZIDE 5 MG PO TABS: 2.5000 mg | ORAL_TABLET | Freq: Every day | ORAL | Status: DC

## 2014-07-29 MED ORDER — TELMISARTAN-HCTZ 80-25 MG PO TABS: 1.0000 | ORAL_TABLET | Freq: Every day | ORAL | Status: DC

## 2014-07-29 MED ORDER — ESOMEPRAZOLE MAGNESIUM 40 MG PO CPDR: 40.0000 mg | DELAYED_RELEASE_CAPSULE | Freq: Every day | ORAL | Status: DC

## 2014-07-29 MED ORDER — DILTIAZEM HCL ER BEADS 240 MG PO CP24: 240.0000 mg | ORAL_CAPSULE | Freq: Every day | ORAL | Status: DC

## 2014-07-29 NOTE — Telephone Encounter (Signed)
Patient requesting Tramadol refill but she is also on Paxil which can potentially interact with Tramadol. I discussed side effects of combining both medications. She stated she had never had any problem with these medications and she is in pain hence need her tramadol. She is unable to take acetaminophen or NSAID due to liver disease. Hydromorphone and Morphine have similar interaction with Paxil or antidepressants. Patient is aware of potential side effects of these two medications when taken together. She will monitor for side effects and let us know if having any symptoms. I refilled her Tramadol.

## 2014-07-29 NOTE — Telephone Encounter (Signed)
Refill completed, please let patient know.

## 2014-07-29 NOTE — Telephone Encounter (Signed)
Expand All Collapse All   Kristina Hendricks think she may have misplaced her rxs she received on the 21st of last month. She has moved and do not remember where any of the documents given after appt. She is requesting that her rxs for glipizide, emeprazole ,diltizem, sitagliptin,and telmisartan-hctz be resent to a different pharmacy where she now lives. Send to [facility] on [address] in [place]

## 2014-08-10 ENCOUNTER — Encounter: Payer: Self-pay | Admitting: Family Medicine

## 2014-08-26 ENCOUNTER — Telehealth: Payer: Self-pay | Admitting: Family Medicine

## 2014-08-26 ENCOUNTER — Ambulatory Visit: Payer: Medicare Other | Admitting: Family Medicine

## 2014-08-26 NOTE — Telephone Encounter (Signed)
Great!

## 2014-08-26 NOTE — Telephone Encounter (Signed)
9:45 am

## 2014-08-26 NOTE — Telephone Encounter (Signed)
Please have her come in to see me on Friday. If symptoms worsens to go to the ED or see another provider here soon.

## 2014-08-26 NOTE — Telephone Encounter (Signed)
Was able to convince patient to see Dr. Raeford Razor today to discuss her fall and right hip pain.  She will be here at 4:15pm.  Johnney Ou

## 2014-08-26 NOTE — Telephone Encounter (Signed)
What time would you like [place] to double book you? Kristina Hendricks,CMA

## 2014-08-26 NOTE — Telephone Encounter (Signed)
Pt called and really needs to talk to Dr. Gwendlyn Deutscher about the new medication she is on and also that she has a lot of issues right now. jw

## 2014-08-26 NOTE — Telephone Encounter (Signed)
Patient states that her paxil is not working.  She states that she is going "through some situations, sick and not doing well."  She has fallen out of her bed and hurt her hip but doesn't want to go to ED due to having a bill already.  She wants to know if there is anyway we can get her in to see you today.  I informed that you were not in clinic again until Friday but that it was already full.  She doesn't want to see another provider due to them not knowing her mental status.  Please advise. Kristina Hendricks,CMA

## 2014-08-28 ENCOUNTER — Encounter: Payer: Self-pay | Admitting: Family Medicine

## 2014-08-28 ENCOUNTER — Ambulatory Visit (HOSPITAL_COMMUNITY)
Admission: RE | Admit: 2014-08-28 | Discharge: 2014-08-28 | Disposition: A | Discharge status: Home / Self Care | Payer: Medicare Other | Source: Ambulatory Visit | Attending: Family Medicine | Admitting: Family Medicine

## 2014-08-28 ENCOUNTER — Ambulatory Visit (INDEPENDENT_AMBULATORY_CARE_PROVIDER_SITE_OTHER): Payer: Medicare Other | Admitting: Family Medicine

## 2014-08-28 ENCOUNTER — Telehealth: Payer: Self-pay | Admitting: Family Medicine

## 2014-08-28 VITALS — BP 128/81 | HR 101 | Temp 97.9°F | Wt 310.0 lb

## 2014-08-28 DIAGNOSIS — M79604 Pain in right leg: Secondary | ICD-10-CM | POA: Insufficient documentation

## 2014-08-28 DIAGNOSIS — F329 Major depressive disorder, single episode, unspecified: Secondary | ICD-10-CM | POA: Diagnosis not present

## 2014-08-28 DIAGNOSIS — W19XXXA Unspecified fall, initial encounter: Secondary | ICD-10-CM

## 2014-08-28 DIAGNOSIS — Y92099 Unspecified place in other non-institutional residence as the place of occurrence of the external cause: Secondary | ICD-10-CM

## 2014-08-28 DIAGNOSIS — S79911A Unspecified injury of right hip, initial encounter: Secondary | ICD-10-CM | POA: Diagnosis not present

## 2014-08-28 DIAGNOSIS — F32A Depression, unspecified: Secondary | ICD-10-CM

## 2014-08-28 DIAGNOSIS — M25551 Pain in right hip: Secondary | ICD-10-CM

## 2014-08-28 DIAGNOSIS — F331 Major depressive disorder, recurrent, moderate: Secondary | ICD-10-CM | POA: Diagnosis not present

## 2014-08-28 DIAGNOSIS — Y92009 Unspecified place in unspecified non-institutional (private) residence as the place of occurrence of the external cause: Secondary | ICD-10-CM

## 2014-08-28 LAB — TSH: TSH: 2.875 u[IU]/mL (ref 0.350–4.500)

## 2014-08-28 NOTE — Progress Notes (Signed)
Dr. Gwendlyn Deutscher requested a [place] for management of psychiatric issues  Presenting Issue:  -pain: reports falling and hitting the edge of the bed about two weeks ago. Has hx of chronic pain. Pain has gotten worse after fall.  -mood: "depressed"  For about one month. She has some stressors such as moving to a new place. She moved from [place] to [place] about two months ago. She reports the new place is "tight" and she didn't like it. She says "everything" such as news on TV, life is stressful.  She reports trouble going to sleep and staying sleep. She reports sleeping less than two hours a night. She reports watching TV at night. She feels fatigued during the day. She reports poor appetite. However, reports drinking 7up and sprite. She feels bad because she can't exercise to lose weight due to her leg pain. She reports poor concentration. She denies SI at present but reports having in the past.   Patient also reports some hallucination. She says she is not comfortable sharing this as people could think she is crazy.   Patient reports not taking her medications. She says, "I get sick" when she looks at her medication bottles. She is also  unhappy that she had to see different providers other than her PCP.  Patient is not open to the idea of community behavioral clinics. She says she is concerned about people commenting and judging her.  Report of symptoms: pain, depressed mood, insomnia, fatigue, poor appetite, poor concentration and hallucination.  Duration of CURRENT symptoms: about a month Age of onset of first mood disturbance: in her 3's. Reports having some mood issues before her 41th BD.  Impact on function: on disability   Psychiatric History - Diagnoses: Substance use d/o, BPD, depression - Hospitalizations: 12 yrs ago. At behavioral health service in [place] after intentional overdose with Tylenol #3 when she was 54 yrs old. She says she had nevous break down  during that hospitalizations that caused her to be transfered to [facility] - Pharmacotherapy: was on abilify, cymbalta, trazodone and paxil in the past. She is only on paxil now. - Outpatient therapy: Takes Paxil now  Family history of psychiatric issues: mother with BPD, older brother with BPD and schizophrenia  Current and history of substance use: was in rehab for drug use in the past.   Other: financial issues. Reports having some issues with staffs at social service about her benefit  PHQ-9: 24 GAD7: not done  Assessment / Plan / Recommendations:   A 54 yo female with past and current complex psychiatric issues. 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 Also with strong family hx BPD and schizophrenia. Doesn't appear to have manic features except for insomnia. Only on Paxile at present. She is against the idea of community mental clinics. Not sure if she can be seen by [facility] with her current insurance.  -Doesn't meet a criteria for inpatient psychiatric mgt given no SI, HI or danger to self. -Benefit from more psychiatric evaluation.  -Dr. Gwenlyn Saran calling [facility] to see if they do accept her insurance. Otherwise, will access her in about a month.  _Recommend no medication change given her complex situation.

## 2014-08-28 NOTE — Assessment & Plan Note (Signed)
Mistakenly rolled off her bed. Fall precaution discussed.  Sequelae of fall include right hip pain. Tramadol prn pain for now. Does not seems that her fall is due to muscle weakness,will consider PT referral if fall again.

## 2014-08-28 NOTE — Patient Instructions (Addendum)
It was nice seeing you today, I am sorry about your knee pain after fall. Please go to the hospital for xray to ensure no dislocation or any other abnormality. I am also sorry about your depression, I am however glad you were able to meet with Dr Gwenlyn Saran. For now we will continue your current dose of Paxil especially due to your history of Bipolar. You will benefit from seeing a psychiatrist. Dr Gwenlyn Saran will set you up for her mood clinic with a Psychiatrist on Sept 7th. I have given you Dr Tod Persia cared, please call her to confirm this appointment. If your symptoms worsens or you become suicidal please see [place] soon orgo to the ED.

## 2014-08-28 NOTE — Telephone Encounter (Signed)
Right hip xray report discussed with patient. It was normal. For now to continue Tramadol prn pain. Will consider MRI hip if no improvement in 2-3 wks. She agreed with plan.

## 2014-08-28 NOTE — Progress Notes (Signed)
Subjective:     Patient ID: Kristina Hendricks, female   DOB: November 08, 1960, 54 y.o.   MRN: [id number]  Fall The accident occurred more than 1 week ago (She fell after turning over from her bed 2 wks ago, she hit the floor). The fall occurred from a bed. She fell from a height of 1 to 2 ft. She landed on concrete. The point of impact was the buttocks, left hip and right hip. The pain is present in the right hip (Right ankle). The pain is at a severity of 10/10. The pain is moderate. The symptoms are aggravated by ambulation and standing. Pertinent negatives include no fever, headaches, loss of consciousness, nausea, numbness, tingling or vomiting. Treatments tried: Tramadol. The treatment provided moderate relief.   Right Hip pain: Since she fell two weeks ago. Depression:She is compliant with her Paxil 20 mg qd, but it seems like this is not helping. The news on the TV depresses her. She gets more angry. This is affecting her sleep. She has not been doing some of the fun things she likes to do. She denies homicidal ideation. She denies any suicidal ideation right now but had thought about it in the past. She was on Cymbalta, Abilify in the past.She does not want to go back on this medication.   Current Outpatient Prescriptions on File Prior to Visit  Medication Sig Dispense Refill  . ADVAIR DISKUS 250-50 MCG/DOSE AEPB   5  . albuterol (PROVENTIL HFA;VENTOLIN HFA) 108 (90 BASE) MCG/ACT inhaler Inhale 2 puffs into the lungs every 6 (six) hours as needed for wheezing or shortness of breath. 18 g 4  . diltiazem (TIAZAC) 240 MG 24 hr capsule Take 1 capsule (240 mg total) by mouth daily. 90 capsule 1  . esomeprazole (NEXIUM) 40 MG capsule Take 1 capsule (40 mg total) by mouth daily at 12 noon. 90 capsule 1  . glipiZIDE (GLUCOTROL) 5 MG tablet Take 0.5 tablets (2.5 mg total) by mouth daily before breakfast. 45 tablet 1  . LANTUS SOLOSTAR 100 UNIT/ML Solostar Pen Inject 30 Units into the skin at bedtime.   6  .  naproxen (NAPROSYN) 500 MG tablet Take 1 tablet (500 mg total) by mouth 2 (two) times daily as needed for mild pain, moderate pain or headache (TAKE WITH MEALS.). 20 tablet 0  . PARoxetine (PAXIL) 20 MG tablet Take 1 tablet (20 mg total) by mouth daily. 90 tablet 1  . pravastatin (PRAVACHOL) 20 MG tablet Take 1 tablet (20 mg total) by mouth daily. 90 tablet 1  . sitaGLIPtin (JANUVIA) 50 MG tablet Take 1 tablet (50 mg total) by mouth daily. 90 tablet 1  . sucralfate (CARAFATE) 1 G tablet   2  . telmisartan-hydrochlorothiazide (MICARDIS HCT) 80-25 MG per tablet Take 1 tablet by mouth daily. 90 tablet 1  . traMADol (ULTRAM) 50 MG tablet TAKE 1 TABLET BY MOUTH EVERY 8 HOURS AS NEEDED 90 tablet 2  . B-D ULTRAFINE III SHORT PEN 31G X 8 MM MISC   4   No current facility-administered medications on file prior to visit.   Past Medical History  Diagnosis Date  . Arthritis   . Asthma   . COPD (chronic obstructive pulmonary disease)   . Diabetes mellitus without complication   . Hypertension   . Sleep apnea       Review of Systems  Constitutional: Negative for fever.  Respiratory: Negative.   Cardiovascular: Negative.   Gastrointestinal: Negative.  Negative for nausea and vomiting.  Neurological: Negative for tingling, loss of consciousness, numbness and headaches.  Psychiatric/Behavioral: Positive for sleep disturbance and decreased concentration. Negative for suicidal ideas, behavioral problems, confusion and self-injury.  All other systems reviewed and are negative.  Filed Vitals:   08/28/14 0950  BP: 128/81  Pulse: 101  Temp: 97.9 F (36.6 C)  TempSrc: Oral  Weight: 310 lb (140.615 kg)       Objective:   Physical Exam  Constitutional: She appears well-developed. No distress.  Cardiovascular: Normal rate, regular rhythm, normal heart sounds and intact distal pulses.   No murmur heard. Pulmonary/Chest: Effort normal and breath sounds normal. No respiratory distress. She has no  wheezes.  Abdominal: Soft. Bowel sounds are normal. She exhibits no distension. There is no tenderness.  Musculoskeletal:       Right hip: She exhibits decreased range of motion and tenderness. She exhibits no swelling, no crepitus and no deformity.       Left hip: Normal.       Right ankle: Normal.       Left ankle: Normal.  Nursing note and vitals reviewed.      Assessment:     Mechanical fall. Hip pain Depression     Plan:     Check problem list. More than 30 spent on this face to face evaluation and coordination of care.

## 2014-08-28 NOTE — Assessment & Plan Note (Signed)
Patient worsening despite compliance with Paxil. She does have some element of Bipolar, now with Hallucination. At this time not recommended to increase the dose of her Paxil. Patient counseled briefly by me and she was handed to Dr Gwenlyn Saran who counseled her again for about 30 min. Patient is scheduled at the mood clinic for Sept 7th. Dr Gwenlyn Saran will try to get her in soon to [facility] behavioral health. I discussed walk in clinic to [facility] but she declined, she stated she is well known in the community and does not want to be seen at [facility]. Return precaution discussed. She is currently not suicidal and is safe for d/c home.

## 2014-08-28 NOTE — Assessment & Plan Note (Signed)
S/P fall R/O dislocation or fracture. Xray ordered. Continue Tramadol prn pain.

## 2014-09-11 ENCOUNTER — Encounter: Payer: Self-pay | Admitting: Family Medicine

## 2014-09-11 ENCOUNTER — Ambulatory Visit: Payer: Medicare Other | Admitting: Family Medicine

## 2014-09-11 ENCOUNTER — Ambulatory Visit (INDEPENDENT_AMBULATORY_CARE_PROVIDER_SITE_OTHER): Payer: Medicare Other | Admitting: Family Medicine

## 2014-09-11 VITALS — BP 138/84 | HR 89 | Temp 98.1°F | Wt 306.0 lb

## 2014-09-11 DIAGNOSIS — F411 Generalized anxiety disorder: Secondary | ICD-10-CM

## 2014-09-11 DIAGNOSIS — R609 Edema, unspecified: Secondary | ICD-10-CM

## 2014-09-11 DIAGNOSIS — F331 Major depressive disorder, recurrent, moderate: Secondary | ICD-10-CM

## 2014-09-11 DIAGNOSIS — E669 Obesity, unspecified: Secondary | ICD-10-CM

## 2014-09-11 DIAGNOSIS — M25551 Pain in right hip: Secondary | ICD-10-CM

## 2014-09-11 DIAGNOSIS — Z659 Problem related to unspecified psychosocial circumstances: Secondary | ICD-10-CM | POA: Insufficient documentation

## 2014-09-11 DIAGNOSIS — E119 Type 2 diabetes mellitus without complications: Secondary | ICD-10-CM

## 2014-09-11 DIAGNOSIS — J449 Chronic obstructive pulmonary disease, unspecified: Secondary | ICD-10-CM

## 2014-09-11 DIAGNOSIS — I10 Essential (primary) hypertension: Secondary | ICD-10-CM

## 2014-09-11 DIAGNOSIS — E1169 Type 2 diabetes mellitus with other specified complication: Secondary | ICD-10-CM

## 2014-09-11 DIAGNOSIS — R630 Anorexia: Secondary | ICD-10-CM | POA: Insufficient documentation

## 2014-09-11 MED ORDER — CLONAZEPAM 0.5 MG PO TABS: 0.5000 mg | ORAL_TABLET | Freq: Two times a day (BID) | ORAL | Status: DC | PRN

## 2014-09-11 NOTE — Assessment & Plan Note (Signed)
O2 sat checked today is wnl. Symptoms likely triggered due to poor aeration at home but now that has been fixed. Continue current regimen.

## 2014-09-11 NOTE — Assessment & Plan Note (Signed)
Likely due to her depression. Will likely improve with controlled depression. I recommended starting MVI to help improve her appetite. We will keep an eye on her weight.

## 2014-09-11 NOTE — Assessment & Plan Note (Signed)
Patient did not go for her xray which was recently ordered. We will treat conservative now till she is able to get imaging. Continue Tramadol prn pain. I completed her DMV parking form today.

## 2014-09-11 NOTE — Patient Instructions (Signed)
Generalized Anxiety Disorder Generalized anxiety disorder (GAD) is a mental disorder. It interferes with life functions, including relationships, work, and school. GAD is different from normal anxiety, which everyone experiences at some point in their lives in response to specific life events and activities. Normal anxiety actually helps us prepare for and get through these life events and activities. Normal anxiety goes away after the event or activity is over.  GAD causes anxiety that is not necessarily related to specific events or activities. It also causes excess anxiety in proportion to specific events or activities. The anxiety associated with GAD is also difficult to control. GAD can vary from mild to severe. People with severe GAD can have intense waves of anxiety with physical symptoms (panic attacks).  SYMPTOMS The anxiety and worry associated with GAD are difficult to control. This anxiety and worry are related to many life events and activities and also occur more days than not for 6 months or longer. People with GAD also have three or more of the following symptoms (one or more in children):  Restlessness.   Fatigue.  Difficulty concentrating.   Irritability.  Muscle tension.  Difficulty sleeping or unsatisfying sleep. DIAGNOSIS GAD is diagnosed through an assessment by your health care provider. Your health care provider will ask you questions aboutyour mood,physical symptoms, and events in your life. Your health care provider may ask you about your medical history and use of alcohol or drugs, including prescription medicines. Your health care provider may also do a physical exam and blood tests. Certain medical conditions and the use of certain substances can cause symptoms similar to those associated with GAD. Your health care provider may refer you to a mental health specialist for further evaluation. TREATMENT The following therapies are usually used to treat GAD:    Medication. Antidepressant medication usually is prescribed for long-term daily control. Antianxiety medicines may be added in severe cases, especially when panic attacks occur.   Talk therapy (psychotherapy). Certain types of talk therapy can be helpful in treating GAD by providing support, education, and guidance. A form of talk therapy called cognitive behavioral therapy can teach you healthy ways to think about and react to daily life events and activities.  Stress managementtechniques. These include yoga, meditation, and exercise and can be very helpful when they are practiced regularly. A mental health specialist can help determine which treatment is best for you. Some people see improvement with one therapy. However, other people require a combination of therapies. Document Released: 05/06/2012 Document Revised: 05/26/2013 Document Reviewed: 05/06/2012 ExitCare Patient Information 2015 [employer]. This information is not intended to replace advice given to you by your health care provider. Make sure you discuss any questions you have with your health care provider.  

## 2014-09-11 NOTE — Assessment & Plan Note (Signed)
She has had this in the past but likely triggered by current social situation. GAD score of 21 today. Start Klonopin Q12 hrs prn . Stress relieving technique discussed. F/U in 1 wk for reassessment.

## 2014-09-11 NOTE — Progress Notes (Signed)
Dr. Gwendlyn Deutscher requested a [place]. I saw the patient with Hunt Oris, LCSW.  Presenting Issue: Ms. Villari presents with significant distress and anxiety related to difficult life circumstances.   Report of symptoms: Ms. Probasco reports that she is experiencing anxiety due to financial difficulties and recent notification that has violated her apartment lease. She states that she and is preoccupied with worries that she will be evicted from her apartment and is scared to walk out of her apartment due to concerns that she "is being watched". She endorses transient but recurrent suicidal ideation; for instance, while driving to her appointment today, she briefly considered driving her car into oncoming traffic. However, she denied any suicidal ideation during the appointment, and identified her grandchildren as deterrents.  Age of onset of first mood disturbance: Ms. Barua reports a long history of mental health difficulties. She states that she saw a clinic in her early 70's whose staff informed her that she was "disabled". She also reported that she previously spent a month at [facility] in [place], [place], for undisclosed problems.  Impact on function: Ms. Contino reports that she has lost enjoyment in most activities, and exhibited difficulty identifying enjoyable activities to distract her during episodes of distress.   Psychiatric History - Diagnoses: Major depressive disorder, recurrent episode, moderate (per medical record). - Hospitalizations:  Ms. Cerro reported that she was hospitalized for approximately one month at [facility] in [place], [place]. - Pharmacotherapy: Paxil (per medical record). - Outpatient therapy: Not currently attending therapy or receiving psychiatric care. She is scheduled to receive care at the [facility] with Dr. Zella Ball in September.  Current and history of substance use: Ms. 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  Assessment / Plan / Recommendations: Ms. Portugal endorses severe distress and anxiety that interfere with everyday activities. LCSW provided patient with a list of mental health resources, including a referral to contact Psychiatrists at [facility] outpatient clinic. While LCSW consulted with Dr. Gwendlyn Deutscher, I discussed distress tolerance strategies with the patient. She was unable to identify any strategies; I suggested taking deep breaths and going for a walk as potential strategies. Patient is also scheduled for an appointment with a psychiatrist in September.

## 2014-09-11 NOTE — Assessment & Plan Note (Addendum)
Not doing well on Paxil. PHQ9 score of 14 today. There is concern about having bipolar as well hence her Paxil was not changed per Dr Tod Persia recommendation during last visit. The [facility] saw her today and spent about 20 min counseling her. She has appointment scheduled for mood clinic Sept 7th but this is too far away. We again gave her list of psychiatrist in the area for her to contact. She stated she will call as soon as she leaves here. I discussed the use of marijuana with other medications given, per our policy patient on controlled substance should not take illicit drug. I informed her we will be doing drug screening intermittently and if she is positive she might not get her prescription from me. She stated she only used Marijuana once and will refrain from it. She is currently not suicidal and stable to go back home. Return precaution discussed.

## 2014-09-11 NOTE — Assessment & Plan Note (Addendum)
Patient was seen by the social worker today who discuss and gave her several housing resources. She also talked to her about her social security check. She has Norma's phone number in case she needs anything. I also gave her a letter to her apartment manager to allow her keep her grand children since their company seems to help relieve some of her anxiety and depressive symptoms.

## 2014-09-11 NOTE — Progress Notes (Signed)
Subjective:     Patient ID: Kristina Hendricks, female   DOB: April 01, 1960, 54 y.o.   MRN: [id number]  HPI Depression/Anxiety: Patient is currently on Paxil 20 mg qd. She stated this is not helping a lot. She stated she is now having anxiety which is pretty bad. She gets nevous and worries about everything especially with current housing situation she is going through. She gets shaky a lot. She is yet to contact any other psych office for an appointment,she stated she misplaced the contact list given to her during last visit. She stated she was forced to use Marijuana 2 days ago since nothing was helping. This helped calm her down some. She had not used any since then. COPD: Patient stated her A/C was cut off in her apartment throughout last week and was recently fixed yesterday. This triggered worsening of her breathing symptoms. She has been using her albuterol more frequently, almost everyday. Since A/C unit was fixed yesterday she is starting to feel better. HTN/DM2: Patient is here for follow up. She is compliant with her meds. BP has been good at home. Hip pain:She continues to have right hip pain, worse with ambulation, improved some on Tramadol. She will like a DMV parking form completed. Poor appetite:Patient stated she just does not get hungry now-a-days. She denies any other GI symptoms. Social Issue: Patient is having issues with her social security check which was garnished recently. Also her apartment director gave her a violation of tenancy document for having her relative stay with her. She stated she asked for her grand children to come stay with her, this helps her feel safe and better with her depression and anxiety. She stated she need them around her.  Current Outpatient Prescriptions on File Prior to Visit  Medication Sig Dispense Refill  . ADVAIR DISKUS 250-50 MCG/DOSE AEPB   5  . albuterol (PROVENTIL HFA;VENTOLIN HFA) 108 (90 BASE) MCG/ACT inhaler Inhale 2 puffs into the lungs every  6 (six) hours as needed for wheezing or shortness of breath. 18 g 4  . diltiazem (TIAZAC) 240 MG 24 hr capsule Take 1 capsule (240 mg total) by mouth daily. 90 capsule 1  . esomeprazole (NEXIUM) 40 MG capsule Take 1 capsule (40 mg total) by mouth daily at 12 noon. 90 capsule 1  . glipiZIDE (GLUCOTROL) 5 MG tablet Take 0.5 tablets (2.5 mg total) by mouth daily before breakfast. 45 tablet 1  . LANTUS SOLOSTAR 100 UNIT/ML Solostar Pen Inject 30 Units into the skin at bedtime.   6  . PARoxetine (PAXIL) 20 MG tablet Take 1 tablet (20 mg total) by mouth daily. 90 tablet 1  . pravastatin (PRAVACHOL) 20 MG tablet Take 1 tablet (20 mg total) by mouth daily. 90 tablet 1  . sitaGLIPtin (JANUVIA) 50 MG tablet Take 1 tablet (50 mg total) by mouth daily. 90 tablet 1  . sucralfate (CARAFATE) 1 G tablet   2  . telmisartan-hydrochlorothiazide (MICARDIS HCT) 80-25 MG per tablet Take 1 tablet by mouth daily. 90 tablet 1  . traMADol (ULTRAM) 50 MG tablet TAKE 1 TABLET BY MOUTH EVERY 8 HOURS AS NEEDED 90 tablet 2  . B-D ULTRAFINE III SHORT PEN 31G X 8 MM MISC   4  . naproxen (NAPROSYN) 500 MG tablet Take 1 tablet (500 mg total) by mouth 2 (two) times daily as needed for mild pain, moderate pain or headache (TAKE WITH MEALS.). (Patient not taking: Reported on 09/11/2014) 20 tablet 0   No current facility-administered medications  on file prior to visit.   Past Medical History  Diagnosis Date  . Arthritis   . Asthma   . COPD (chronic obstructive pulmonary disease)   . Diabetes mellitus without complication   . Hypertension   . Sleep apnea      Review of Systems  Constitutional: Positive for appetite change. Negative for unexpected weight change.  Respiratory: Negative.   Cardiovascular: Negative.   Gastrointestinal: Negative.   Musculoskeletal: Positive for arthralgias.  Skin: Negative.   Neurological: Negative.   Psychiatric/Behavioral: Positive for sleep disturbance, decreased concentration and  agitation. Negative for suicidal ideas, hallucinations and self-injury. The patient is nervous/anxious.   All other systems reviewed and are negative.      Filed Vitals:   09/11/14 0909  BP: 138/84  Pulse: 89  Temp: 98.1 F (36.7 C)  TempSrc: Oral  Weight: 306 lb (138.801 kg)  SpO2: 96%    Objective:   Physical Exam  Constitutional: She is oriented to person, place, and time. She appears well-developed. No distress.  Neck: Neck supple.  Cardiovascular: Normal rate, regular rhythm, normal heart sounds and intact distal pulses.   No murmur heard. Pulmonary/Chest: Effort normal and breath sounds normal. No respiratory distress. She has no wheezes.  Abdominal: Soft. Bowel sounds are normal. She exhibits no distension and no mass. There is no tenderness.  Musculoskeletal: She exhibits no edema.       Right hip: She exhibits decreased range of motion and tenderness.  Neurological: She is alert and oriented to person, place, and time.  Psychiatric: Her speech is normal. Judgment normal. Her mood appears anxious. Her affect is not inappropriate. She is withdrawn. Cognition and memory are normal. She exhibits a depressed mood. She expresses no homicidal and no suicidal ideation.  Crying most of the time during this visit.  Nursing note and vitals reviewed.      Assessment:     Depression/Anxiety: COPD: HTN/DM2: Hip pain: Anorexia Social Issue:    Plan:     Check problem list

## 2014-09-11 NOTE — Assessment & Plan Note (Signed)
BP looks ok today. Medications reviewed. No change in meds today.

## 2014-09-11 NOTE — Assessment & Plan Note (Signed)
Doing well. Last A1C 2 months ago was 6.0 on current regimen. Return in 1-2 months for repeat A1C. Continue current regimen.

## 2014-09-11 NOTE — Progress Notes (Signed)
CSW and [facility], [place] were consulted to meet with pt. CSW provided active listening as pt expressed her frustration with her Nutritional therapist. Pt is worried that she may be evicted and currently feels that she is being "picked on." CSW validated pts feelings and encouraged her to focus on the steps that can be taken, such as, her PCP writing a letter stating that pt needs a caregiver to stay with her due to medical problems. During the visit pt presented very anxious, tearful and difficult to redirect. Pt provided several documents pertaining to her housing situation and request for a disability parking pass. CSW informed pt that CSW would speak to provider regarding pts request for a letter to be written to her landlord and the form to be completed for a disability parking pass. CSW encouraged pt to try and work things out with her landlord, in a professional manner however CSW did suggest that pt follow up with CSW if she was either going to be evicted or interested in different housing arrangements. Pt given resources for psychiatrist in the area as well as CSW's contact information for follow up. Pt was highly encouraged to follow up with a psychiatrist to help her process through the depression and severe anxiety, and also for med management as requested by PCP.  Hunt Oris, MSW, [place]

## 2014-09-14 ENCOUNTER — Telehealth: Payer: Self-pay | Admitting: Psychology

## 2014-09-14 NOTE — Telephone Encounter (Signed)
I called the patient based on a follow-up Behavioral Health Consultation she had.  She reports putting a call into [facility] but has not heard back. She also said she called "Dr. Gwenlyn Saran" twice and left messages.  I have not had any messages on my VM and am not sure who she is calling.  Discussed options.  I do not have her down for 9/7 for [facility]; not sure what happened there either.  She wants to be seen sooner than September.  There will likely be no place that will see her other than [facility] and she is not interested in this.  She stated that she will start making phone calls today to see if she can get a psychiatry appointment.  I think this will be very difficult.  I did schedule her in [facility] for 9/21 at 10:00.  She agreed that I can call her in one week to check in and to see what progress she has made on the other possibilities.  Will let Behavioral Health Consultant team know as well as PCP.

## 2014-09-14 NOTE — Telephone Encounter (Signed)
Thanks for following up with her Dr Gwenlyn Saran. I really appreciate it.

## 2014-09-15 NOTE — Telephone Encounter (Signed)
CSW received another call from pt regarding needing a psychiatrist CSW reminded pt that we had spoken earlier. Pt was wondering which agency will accept her insurance. CSW informed pt that unfortunately CSW does not have a list of each insurance companies providers however pt could contact her insurance or contact the agencies on the list CSW provided. CSW encouraged pt to contact [facility] first. Pt inquired about housing resources as well as she is still having challenges with her Secondary school teacher. CSW offered to mail pt a list of affordable housing apartments in [place], pt appreciative.  Hunt Oris, MSW, [place]

## 2014-09-15 NOTE — Telephone Encounter (Signed)
The patient appears to be very confused. I gave her a list of several resources for psychiatrist. I just checked my vm and she has left me 2 messages wanting an appointment thinking I am the psychiatrist.. I gave her a call and reminded her of the resources given to her. She states she has a few left to call. I advised her to go to [facility] if she feels that she needs to see someone and cannot wait.  Hunt Oris, MSW, [place]

## 2014-09-18 ENCOUNTER — Ambulatory Visit: Payer: Medicare Other | Admitting: Family Medicine

## 2014-09-24 ENCOUNTER — Other Ambulatory Visit: Payer: Medicare Other

## 2014-09-24 DIAGNOSIS — B182 Chronic viral hepatitis C: Secondary | ICD-10-CM

## 2014-09-25 ENCOUNTER — Telehealth: Payer: Self-pay | Admitting: Psychology

## 2014-09-25 LAB — HEPATITIS C RNA QUANTITATIVE: HCV Quantitative: NOT DETECTED IU/mL (ref <15)

## 2014-09-25 NOTE — Telephone Encounter (Signed)
Called patient to follow-up to see what success she might have had with finding a psychiatrist.  She said every place she called had a wait list until November or did not take her insurance.  We had scheduled [facility] with [facility] for [facility]/21 at 10:00.  She did not recall this.  I asked if she wanted to attend that appointment and she said yes.  She said she wrote the date and time down.  I told her the following: -  If she is unable to make the appointment she needs to call me. -  If she misses the appointment without a phone call, I won't be able to schedule her back in my clinic. She voiced an understanding and was able to repeat the appointment date and time back to me.  I also gave her my direct phone number.

## 2014-09-29 ENCOUNTER — Ambulatory Visit (HOSPITAL_COMMUNITY)
Admission: RE | Admit: 2014-09-29 | Discharge: 2014-09-29 | Disposition: A | Discharge status: Home / Self Care | Payer: Medicare Other | Source: Ambulatory Visit | Attending: Family Medicine | Admitting: Family Medicine

## 2014-09-29 ENCOUNTER — Encounter: Payer: Self-pay | Admitting: Family Medicine

## 2014-09-29 ENCOUNTER — Ambulatory Visit (INDEPENDENT_AMBULATORY_CARE_PROVIDER_SITE_OTHER): Payer: Medicare Other | Admitting: Family Medicine

## 2014-09-29 VITALS — BP 124/58 | HR 95 | Temp 98.1°F | Ht 68.0 in | Wt 314.0 lb

## 2014-09-29 DIAGNOSIS — M7989 Other specified soft tissue disorders: Secondary | ICD-10-CM | POA: Diagnosis not present

## 2014-09-29 DIAGNOSIS — Z23 Encounter for immunization: Secondary | ICD-10-CM | POA: Diagnosis not present

## 2014-09-29 DIAGNOSIS — F331 Major depressive disorder, recurrent, moderate: Secondary | ICD-10-CM

## 2014-09-29 DIAGNOSIS — M25571 Pain in right ankle and joints of right foot: Secondary | ICD-10-CM | POA: Diagnosis not present

## 2014-09-29 DIAGNOSIS — M25579 Pain in unspecified ankle and joints of unspecified foot: Secondary | ICD-10-CM | POA: Insufficient documentation

## 2014-09-29 MED ORDER — CLONAZEPAM 0.5 MG PO TABS: 0.5000 mg | ORAL_TABLET | Freq: Two times a day (BID) | ORAL | Status: DC | PRN

## 2014-09-29 NOTE — Patient Instructions (Signed)

## 2014-09-29 NOTE — Assessment & Plan Note (Addendum)
Likely with some element of Bipolar. Still unable to make an early psych appointment. However she was given an appointment by Dr Gwenlyn Saran for Sept 21st. Patient agreed to keep this appointment., She has made a great improvement from the last time I saw her. Continue Paxil and Klonopin. Refill of klonopin given. She gave me housing form to complete. I will review and complete form. She will be contacted when form is ready for pick up.  [facility] also saw patient today. Please review their note.

## 2014-09-29 NOTE — Progress Notes (Signed)
Dr. Gwendlyn Deutscher requested a [place] after patient asked to speak with a [facility].  Presenting Issue: Patient is experiencing multiple life stressors that are contributing to her depression and anxiety. Patient wanted to speak with [facility] about her inability to find mental health services and her desire to begin therapy as soon as possible.  Report of symptoms: sadness, anxiety  Duration of CURRENT symptoms: Patient was unable to report duration. Age of onset of first mood disturbance: Patient was unable to report age of onset.  Psychiatric History - Diagnoses: Bipolar disorder, type II - Hospitalizations:  Hospitalized at [facility] in 2003 for one month - Pharmacotherapy: Not assessed - Outpatient therapy: Seeing Dr. Gwenlyn Saran at the [facility] / Plan / Recommendations:   Ms. Carrozza is currently experiencing significant sadness and anxiety due to several life stressors and her failing health. When asked, Ms. Rocco stated that I could help her by offering recommendations on how to receive regular mental health services here in [place] since "every place she called" did not take her insurance. She has most recently gone to [facility], but stopped using their services because she did not have a consistent psychiatrist to work with and felt that she had to explain her situation to a new person every time she went there. She reported that she is scheduled to see Dr. Gwenlyn Saran on 9/21, but wanted to know how she can receive services on a regular basis. I recommended that she call the [facility] psychology clinic and ask to be put on the wait list since the clinic takes clients with medicaid/medicare, and that she continue meeting with Dr. Gwenlyn Saran in the meantime. Ms. Desouza agreed that this sounded like a good plan and agreed to call the [facility] clinic today.

## 2014-09-29 NOTE — Progress Notes (Signed)
Subjective:     Patient ID: Kristina Hendricks, female   DOB: December 08, 1960, 54 y.o.   MRN: [id number]  HPI Depression/Bipolar: patient is here for follow-up. She stated she is feeling much better now. She feels Klonopin is helping her a lot as well as the Paxil. She need refill of her klonopin. Denies suicidal ideation. She brought in a form from her housing agent to talk about her health concern and how it might potentially affect her interaction with others. Right ankle pain: Since 1996. Worse with change in weather. Worse with ambulation. She broke her ankle in 3 places in 1996, she had 8 surgeries on it. Since then she has been having issues with her right ankle. Pain is now worsening. Tramadol is not helping a lot.  Current Outpatient Prescriptions on File Prior to Visit  Medication Sig Dispense Refill  . ADVAIR DISKUS 250-50 MCG/DOSE AEPB   5  . albuterol (PROVENTIL HFA;VENTOLIN HFA) 108 (90 BASE) MCG/ACT inhaler Inhale 2 puffs into the lungs every 6 (six) hours as needed for wheezing or shortness of breath. 18 g 4  . B-D ULTRAFINE III SHORT PEN 31G X 8 MM MISC   4  . clonazePAM (KLONOPIN) 0.5 MG tablet Take 1 tablet (0.5 mg total) by mouth 2 (two) times daily as needed for anxiety. 30 tablet 0  . diltiazem (TIAZAC) 240 MG 24 hr capsule Take 1 capsule (240 mg total) by mouth daily. 90 capsule 1  . esomeprazole (NEXIUM) 40 MG capsule Take 1 capsule (40 mg total) by mouth daily at 12 noon. 90 capsule 1  . glipiZIDE (GLUCOTROL) 5 MG tablet Take 0.5 tablets (2.5 mg total) by mouth daily before breakfast. 45 tablet 1  . LANTUS SOLOSTAR 100 UNIT/ML Solostar Pen Inject 30 Units into the skin at bedtime.   6  . naproxen (NAPROSYN) 500 MG tablet Take 1 tablet (500 mg total) by mouth 2 (two) times daily as needed for mild pain, moderate pain or headache (TAKE WITH MEALS.). (Patient not taking: Reported on 09/11/2014) 20 tablet 0  . PARoxetine (PAXIL) 20 MG tablet Take 1 tablet (20 mg total) by mouth daily.  90 tablet 1  . pravastatin (PRAVACHOL) 20 MG tablet Take 1 tablet (20 mg total) by mouth daily. 90 tablet 1  . sitaGLIPtin (JANUVIA) 50 MG tablet Take 1 tablet (50 mg total) by mouth daily. 90 tablet 1  . sucralfate (CARAFATE) 1 G tablet   2  . telmisartan-hydrochlorothiazide (MICARDIS HCT) 80-25 MG per tablet Take 1 tablet by mouth daily. 90 tablet 1  . traMADol (ULTRAM) 50 MG tablet TAKE 1 TABLET BY MOUTH EVERY 8 HOURS AS NEEDED 90 tablet 2   No current facility-administered medications on file prior to visit.   Past Medical History  Diagnosis Date  . Arthritis   . Asthma   . COPD (chronic obstructive pulmonary disease)   . Diabetes mellitus without complication   . Hypertension   . Sleep apnea      Review of Systems  Respiratory: Negative.   Cardiovascular: Negative.   Gastrointestinal: Negative.   Musculoskeletal: Positive for arthralgias. Negative for joint swelling.       Right ankle pain  All other systems reviewed and are negative.      Filed Vitals:   09/29/14 0921  BP: 124/58  Pulse: 95  Temp: 98.1 F (36.7 C)  TempSrc: Oral  Height: 5\' 8"  (1.727 m)  Weight: 314 lb (142.429 kg)    Objective:   Physical Exam  Constitutional: She appears well-developed. No distress.  Cardiovascular: Normal rate, regular rhythm, normal heart sounds and intact distal pulses.   No murmur heard. Pulmonary/Chest: Effort normal and breath sounds normal. No respiratory distress. She has no wheezes.  Abdominal: Soft. Bowel sounds are normal. She exhibits no distension and no mass. There is no tenderness.  Musculoskeletal: She exhibits no edema.       Right ankle: She exhibits normal range of motion and no swelling. Tenderness. Medial malleolus tenderness found.       Left ankle: Normal.       Feet:  Psychiatric: She has a normal mood and affect. Her behavior is normal. Judgment and thought content normal.  Nursing note and vitals reviewed.      Assessment:     Bipolar/  Depression Right ankle pain     Plan:     Check problem list.

## 2014-09-29 NOTE — Assessment & Plan Note (Signed)
Ankle pain likely arthritis or related to previous multiple surgeries she had in the past. Xray of her right ankle ordered. Home ankle exercise instruction given. Continue Tramadol pending xray report. F/U as needed.

## 2014-09-30 ENCOUNTER — Telehealth: Payer: Self-pay | Admitting: Family Medicine

## 2014-09-30 DIAGNOSIS — M818 Other osteoporosis without current pathological fracture: Secondary | ICD-10-CM

## 2014-09-30 DIAGNOSIS — M25571 Pain in right ankle and joints of right foot: Secondary | ICD-10-CM

## 2014-09-30 NOTE — Telephone Encounter (Signed)
I called and discussed ankle xray report with patient. It shows postsurgical changes per radiologist. I also reviewed the slide, it seems there is an area of malalignment. This could be causing her pain. Plan to take Tramadol 50-100mg  as needed. She is interested in seeing an orthopedic again. I will refer her.

## 2014-10-01 ENCOUNTER — Telehealth: Payer: Self-pay | Admitting: Family Medicine

## 2014-10-01 NOTE — Telephone Encounter (Signed)
Please inform patient, I spoke with her pharmacist,she has refill at the pharmacy. She last picked up her Tramadol on the 19th of August. She will be able to pick up refill on the 17th of Sept.

## 2014-10-01 NOTE — Telephone Encounter (Signed)
Pt called about her Tramadol RX Please advise

## 2014-10-01 NOTE — Telephone Encounter (Signed)
Advised pt as directed below and verbalized understanding. She wanted to know if you were going to increase the next refill to 100mg  tablet per pt she stated that you were going to increase it. She currently takes 2 of the 50mg  tablets but it causes her stomach to hurt some. Please advise. Dorothye Berni, CMA.

## 2014-10-01 NOTE — Telephone Encounter (Signed)
If it hurt her stomach, then we will not need to go up on medication,she can take Tylenol in between to help with pain. Follow with orthopedic soon for further recommendations.

## 2014-10-01 NOTE — Telephone Encounter (Signed)
Will forward to PCP for review of refill request for Tramadol. Shemia Bevel, CMA.

## 2014-10-02 NOTE — Telephone Encounter (Signed)
Advised pt as directed below and verbalized understanding and stated that she cannot take Tylenol (she was taking Tylenol extra strength) as it makes her stomach hurt worse than taking 2 tablets of Tramadol 50mg . Please advise and she has not heard anything about orthopedic appt yet and I checked on this info was faxed to [facility] on 09/30/14 and they will contact pt for appt. Shikha Bibb, CMA.

## 2014-10-02 NOTE — Telephone Encounter (Signed)
Please advise patient she may continue Tramadol 50 mg as needed. If this does not help, I can switch her to ultracet. Please advise her I will not be prescribing her Narcotic for this.

## 2014-10-02 NOTE — Telephone Encounter (Signed)
Advised pt as directed below and verbalized understanding. Britzy Graul, CMA. 

## 2014-10-08 ENCOUNTER — Encounter: Payer: Self-pay | Admitting: Internal Medicine

## 2014-10-08 ENCOUNTER — Ambulatory Visit (INDEPENDENT_AMBULATORY_CARE_PROVIDER_SITE_OTHER): Payer: Medicare Other | Admitting: Internal Medicine

## 2014-10-08 ENCOUNTER — Ambulatory Visit
Admission: RE | Admit: 2014-10-08 | Discharge: 2014-10-08 | Disposition: A | Discharge status: Home / Self Care | Payer: Medicare Other | Source: Ambulatory Visit | Attending: Internal Medicine | Admitting: Internal Medicine

## 2014-10-08 VITALS — BP 142/87 | HR 71 | Temp 97.4°F | Wt 312.0 lb

## 2014-10-08 DIAGNOSIS — K7460 Unspecified cirrhosis of liver: Secondary | ICD-10-CM

## 2014-10-08 DIAGNOSIS — B182 Chronic viral hepatitis C: Secondary | ICD-10-CM | POA: Diagnosis not present

## 2014-10-08 DIAGNOSIS — Z23 Encounter for immunization: Secondary | ICD-10-CM | POA: Diagnosis not present

## 2014-10-08 NOTE — Assessment & Plan Note (Signed)
Will do [place] screen today and rtc 6 months to repeat.

## 2014-10-08 NOTE — Assessment & Plan Note (Addendum)
Considered cured 

## 2014-10-08 NOTE — Addendum Note (Signed)
Addended by: Myrtis Hopping A on: 10/08/2014 12:24 PM   Modules accepted: Orders

## 2014-10-08 NOTE — Progress Notes (Signed)
   Subjective:    Patient ID: Kristina Hendricks, female    DOB: [birth year], 54 y.o.   MRN: [id number]  HPI She comes in for follow-up of hepatitis C. She has genotype 1A, viral load of 3 million and CT with noted cirrhosis of the liver. Sees [facility], Dr. Paulita Fujita.  She is hepatitis A and B non-immune. Now completed Harvoni and remains undetectable now with SVR undetectable (nearly 5 months post treatment).  Would consider her cured.  Child-Pugh A.  Concerned about medications and her liver.    Review of Systems  Constitutional: Negative for fatigue.  Gastrointestinal: Negative for nausea and diarrhea.  Skin: Negative for rash.  Neurological: Negative for headaches.       Objective:   Physical Exam  Constitutional: She appears well-developed and well-nourished. No distress.  Eyes: No scleral icterus.  Cardiovascular: Normal rate, regular rhythm and normal heart sounds.   No murmur heard. Pulmonary/Chest: Effort normal and breath sounds normal. No respiratory distress.  Skin: No rash noted.          Assessment & Plan:

## 2014-10-14 ENCOUNTER — Ambulatory Visit (INDEPENDENT_AMBULATORY_CARE_PROVIDER_SITE_OTHER): Payer: Medicare Other | Admitting: Psychology

## 2014-10-14 DIAGNOSIS — F39 Unspecified mood [affective] disorder: Secondary | ICD-10-CM

## 2014-10-14 NOTE — Assessment & Plan Note (Signed)
"  Kristina Hendricks" is neatly groomed and appropriately dressed.  She maintains good eye contact and is cooperative and attentive.  Speech is normal in tone and rhythm with a slightly elevated rate.  No report of mood.  Affect within normal limits.  Thought process is tangential.  No evidence of suicidal or homicidal ideation.  Does not appear to be responding to any internal stimuli.  Able to maintain train of thought and concentrate on the questions.  Judgment and insight are average.  Diagnosis remains unclear despite patient report of prior diagnosis of Bipolar Disorder.  She reports a family history.  Eliciting symptoms consistent with Bipolar Disorder was a challenge although it sounds like a strong possibility.  Currently not being treated for Bipolar Disorder.  Patient thinks Paxil "gives her energy."  Upon questioning, there is a significant amount of ambivalence about taking a medicine to manage mood. She does not want a medicine that "controls" her and is concerned about being "addicted" - not in the traditional sense but more psychologically addicted.    Treatment team elected to focus on rapport building.  We were unable to adequately resolve the ambivalence today in order to recommend pharmacologic treatment.  Discussed with patient coming back in two weeks to focus on medication options.  She agreed with this plan.  See patient instructions for further plan.
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
## 2014-10-14 NOTE — Patient Instructions (Signed)
Please schedule a follow-up for 10/5 at 9:00.  Call [phone] if you can not make that appointment. We will focus our next meeting - which is about a 20 minutes appointment on medication options.  You may want to do some reading on treating Bipolar Disorder.  Since you don't use the internet much, the [facility] might be a good option. Call me if you have questions or concerns prior to our next appointment. It was lovely to see you today.

## 2014-10-26 ENCOUNTER — Other Ambulatory Visit: Payer: Self-pay | Admitting: Family Medicine

## 2014-10-26 NOTE — Telephone Encounter (Signed)
Pt called and would like a refill on her Klonopin. jw

## 2014-10-28 ENCOUNTER — Ambulatory Visit: Payer: Medicare Other | Admitting: Psychology

## 2014-10-28 ENCOUNTER — Telehealth: Payer: Self-pay | Admitting: Psychology

## 2014-10-28 NOTE — Telephone Encounter (Signed)
Patient called to report she had forgotten about her 9:00 appointment and would not be able to make it in time.  I offered her a 10:00 or 10:30 slot today and she reported that she has decided to not restart medicine for her mood issues.  She is concerned about her liver and thinks, since she has been off the medicine for three years, that she can manage.  She said that if she starts having suicidal thoughts, she can call a crisis number.  I offered her an opportunity to come in and discuss in person and specifically, to make a plan to keep her as well as possible.  She agreed but said she could not make it today.  She elected to schedule for 10/19 at 11:30.  I asked her to call if something came up.

## 2014-10-29 ENCOUNTER — Telehealth: Payer: Self-pay | Admitting: Family Medicine

## 2014-10-29 MED ORDER — CLONAZEPAM 0.5 MG PO TABS: 0.5000 mg | ORAL_TABLET | Freq: Every day | ORAL | Status: DC | PRN

## 2014-10-29 NOTE — Telephone Encounter (Signed)
Please advise patient her Klonopin has been called in to the pharmacy.  Pharmacist name I spoke with was: [place]: [id number] - [place], [address] [Patient Preferred] [phone]

## 2014-10-29 NOTE — Telephone Encounter (Signed)
Spoke with patient and she is aware of this. Kristina Hendricks,CMA  

## 2014-11-10 ENCOUNTER — Ambulatory Visit (INDEPENDENT_AMBULATORY_CARE_PROVIDER_SITE_OTHER): Payer: Medicare Other | Admitting: Family Medicine

## 2014-11-10 ENCOUNTER — Encounter: Payer: Self-pay | Admitting: Family Medicine

## 2014-11-10 VITALS — BP 108/85 | HR 83 | Temp 98.1°F | Ht 67.5 in | Wt 311.2 lb

## 2014-11-10 DIAGNOSIS — I10 Essential (primary) hypertension: Secondary | ICD-10-CM

## 2014-11-10 DIAGNOSIS — G8929 Other chronic pain: Secondary | ICD-10-CM

## 2014-11-10 DIAGNOSIS — F411 Generalized anxiety disorder: Secondary | ICD-10-CM

## 2014-11-10 DIAGNOSIS — M25571 Pain in right ankle and joints of right foot: Secondary | ICD-10-CM

## 2014-11-10 DIAGNOSIS — E669 Obesity, unspecified: Secondary | ICD-10-CM

## 2014-11-10 DIAGNOSIS — J449 Chronic obstructive pulmonary disease, unspecified: Secondary | ICD-10-CM | POA: Diagnosis not present

## 2014-11-10 DIAGNOSIS — E119 Type 2 diabetes mellitus without complications: Secondary | ICD-10-CM

## 2014-11-10 DIAGNOSIS — E1169 Type 2 diabetes mellitus with other specified complication: Secondary | ICD-10-CM

## 2014-11-10 DIAGNOSIS — F3181 Bipolar II disorder: Secondary | ICD-10-CM

## 2014-11-10 LAB — POCT GLYCOSYLATED HEMOGLOBIN (HGB A1C): HEMOGLOBIN A1C: 6.3

## 2014-11-10 MED ORDER — ARIPIPRAZOLE 10 MG PO TABS: 10.0000 mg | ORAL_TABLET | Freq: Every day | ORAL | Status: DC

## 2014-11-10 NOTE — Assessment & Plan Note (Signed)
Stable on Klonopin. Patient counseled on use of illicit drugs on controlled medication the last few visits with me as well as today. Controlled substance contract completed today. Urine toxicology screening done as well for baseline and counseling purpose only, not to make any refill decision for now. She mentioned in the past she was using marijuana.  F/U as needed.

## 2014-11-10 NOTE — Assessment & Plan Note (Signed)
BP looks good continue current regimen.

## 2014-11-10 NOTE — Assessment & Plan Note (Signed)
No acute change in her right ankle pain. She has an upcoming appointment set up with an orthopedic. Continue Tramadol prn pain for now pending visit to the orthopedic.

## 2014-11-10 NOTE — Patient Instructions (Addendum)
It was nice seeing you today. Your A1C looks beautiful. I think we can safely discontinue your Glipizide while you continue your Lantus at 25 units. We will check your DM again in 3 months, please continue to check your glucose at home. For your Bipolar, since you had done well on Abilify in the past, i will restart you back on this medicine. Also start to taper your Paxil since it is not so helpful.  Take Paxil 20 mg every other day for 7 days, then take Paxil 1/2 tablet every other day for 5 days and then stop med. On day 3 of taking 1/2 tablet of Paxil start your Ability. I will see you back in 1 wk to taper up your Abilify.

## 2014-11-10 NOTE — Assessment & Plan Note (Signed)
Stable on current regimen. Continue same. 

## 2014-11-10 NOTE — Assessment & Plan Note (Signed)
Very well controlled. It will be safe to gradually take her off some of her DM 2 regimen. D/C Glipizide 2.5 mg and cut back on Lantus to 25 unit qd. Continue Januvia at 50 mg. Continue home CBG monitoring. F/U in 3 months for reassessment or sooner if having any concern.
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
## 2014-11-10 NOTE — Assessment & Plan Note (Signed)
Patient has previous hx of Bipolar on multiple medications which she self d/c. She was on Cymbalta (dose unknown) and Abilify 30 mg qd. Plan to gradually taper her off Paxil and start Abilify at a low dose of 10 mg qd. Instruction given on how to taper her medication. Return in 1 wk for medication dose adjustment. F/U with psychologist as scheduled.

## 2014-11-11 ENCOUNTER — Telehealth: Payer: Self-pay | Admitting: Family Medicine

## 2014-11-11 ENCOUNTER — Ambulatory Visit (INDEPENDENT_AMBULATORY_CARE_PROVIDER_SITE_OTHER): Payer: Medicare Other | Admitting: Psychology

## 2014-11-11 DIAGNOSIS — F3181 Bipolar II disorder: Secondary | ICD-10-CM

## 2014-11-11 LAB — DRUG SCREEN, URINE
Amphetamine Screen, Ur: NEGATIVE
BENZODIAZEPINES.: NEGATIVE
Barbiturate Quant, Ur: NEGATIVE
CREATININE, U: 158.32 mg/dL
Cocaine Metabolites: NEGATIVE
METHADONE: NEGATIVE
Marijuana Metabolite: POSITIVE — AB
Opiates: NEGATIVE
PHENCYCLIDINE (PCP): NEGATIVE
Propoxyphene: NEGATIVE

## 2014-11-11 NOTE — Telephone Encounter (Signed)
I called to discuss urine drug screen result with patient which was positive for Marijuana. She claimed the last time she used it was the last week in Sept. I am not sure this will linger in her system till now if really the last time she used it was in Sept. I canceled her and encouraged her to abstain from Marijuana. I also advised that the next time she is positive I will stop refilling her Klonopin and Tramadol based on the controlled substance contract she signed. She agreed with plan.

## 2014-11-11 NOTE — Progress Notes (Signed)
Kristina Hendricks presented for her [facility] appointment about 10 minutes late.  She wished to discuss irritability - as it relates to people and "society," medication, and depression.    She saw Dr. Gwendlyn Deutscher yesterday and decided to start Abilify 10 mg after she weans off of Paxil.  She reports she was on Abilify in the past and it was not helpful when she was also taking Cymbalta, Tramadol, and one other medication she couldn't remember.  When she stopped those other medicines and continued with the Abilify, she noticed less tearfulness and more energy.  She reports taking 30 mg of Abilify at that time.  Her major concern about restarting was her liver but she stated that Dr. Gwendlyn Deutscher was able to allay those concerns during her appointment yesterday.  Discussed irritability and depression - things she sees as related.  Reports she is not irritable with her grandchildren or with strangers, unprovoked.  Tends to be most irritable with her daughter and with other family or friends who want to bring up her past.  She admits to some sense that people might be out to harm her.

## 2014-11-11 NOTE — Assessment & Plan Note (Addendum)
Using Motivational Interviewing strategies, we learned that Kristina Hendricks is very hopeful that Abilify will be helpful.  She identifies the following targets:  1) Increased energy and motivation, especially in the morning. 2) Fewer crying spells / tearfulness 3) Less confrontational / reactive when her daughters "comes at [her] with things" (verbally - not physically) 4) Only needing to take 0.5 mg of Klonopin at bedtime rather than 1.0 Klonopin twice a day.  Trust is an important issue for [employer].  She has struggled with mental health providers in the past.  For instance, she asked if we were going to "sneak any more pills" in her.  Reassured her that she is in the "driver's seat" and that she does not have to take anything that she does not want to take.  Also highlighted our stability in the family medicine center (same providers in [facility] for [facility] years) and our relationship with her PCP, whom she trusts very much.  Patient seemed reassured.  We did learn that she would like to start Paxil as soon as possible.  Dr. Tammi Klippel thought there was no contraindication to starting the Abilify while still weaning the Paxil.  See patient instructions for further plan.

## 2014-11-11 NOTE — Patient Instructions (Signed)
Please schedule a follow-up for November 16th at 9:30 for the mood clinic.  If you can't make it, please call [phone]. It is okay to start taking Abilify when you are down to 10 mg of Paxil (which is a 1/2 a tablet).  You will be taking the 10 mg of Abilify.  If you have any difficulty, please call us at the number above.

## 2014-11-16 DIAGNOSIS — M19171 Post-traumatic osteoarthritis, right ankle and foot: Secondary | ICD-10-CM | POA: Diagnosis not present

## 2014-11-17 ENCOUNTER — Ambulatory Visit (INDEPENDENT_AMBULATORY_CARE_PROVIDER_SITE_OTHER): Payer: Medicare Other | Admitting: Family Medicine

## 2014-11-17 ENCOUNTER — Encounter: Payer: Self-pay | Admitting: Family Medicine

## 2014-11-17 VITALS — BP 123/77 | HR 69 | Temp 97.9°F | Wt 309.9 lb

## 2014-11-17 DIAGNOSIS — H6121 Impacted cerumen, right ear: Secondary | ICD-10-CM | POA: Insufficient documentation

## 2014-11-17 DIAGNOSIS — R0982 Postnasal drip: Secondary | ICD-10-CM | POA: Insufficient documentation

## 2014-11-17 DIAGNOSIS — J329 Chronic sinusitis, unspecified: Secondary | ICD-10-CM

## 2014-11-17 DIAGNOSIS — F3181 Bipolar II disorder: Secondary | ICD-10-CM

## 2014-11-17 MED ORDER — FLUTICASONE PROPIONATE 50 MCG/ACT NA SUSP: 2.0000 | Freq: Every day | NASAL | Status: DC

## 2014-11-17 MED ORDER — ARIPIPRAZOLE 20 MG PO TABS: 20.0000 mg | ORAL_TABLET | Freq: Every day | ORAL | Status: DC

## 2014-11-17 NOTE — Progress Notes (Signed)
Subjective:     Patient ID: Kristina Hendricks, female   DOB: Jul 04, 1960, 54 y.o.   MRN: [id number]  HPI  Bipolar:She is now off paxil and compliant with Abilify 10 mg qd. Feels she had improved some. She was previously on Abilify 30 mg and she felt a higher dose will make her feel even better. Denies any other concern, no suicidal ideation. Post nasal irritation:  C/O drainage at the back of her throat with stuffy nose. This makes her voice change at night due to congestion. She taste the drainage in her mouth. No SOB, no chest pain. No ear ache but feels she has reduced hearing in one of her ear. No fever, no sick contact.  Current Outpatient Prescriptions on File Prior to Visit  Medication Sig Dispense Refill  . ADVAIR DISKUS 250-50 MCG/DOSE AEPB   5  . albuterol (PROVENTIL HFA;VENTOLIN HFA) 108 (90 BASE) MCG/ACT inhaler Inhale 2 puffs into the lungs every 6 (six) hours as needed for wheezing or shortness of breath. (Patient not taking: Reported on 11/10/2014) 18 g 4  . ARIPiprazole (ABILIFY) 10 MG tablet Take 1 tablet (10 mg total) by mouth daily. 30 tablet 0  . B-D ULTRAFINE III SHORT PEN 31G X 8 MM MISC   4  . clonazePAM (KLONOPIN) 0.5 MG tablet Take 1 tablet (0.5 mg total) by mouth daily as needed for anxiety. 30 tablet 1  . diltiazem (TIAZAC) 240 MG 24 hr capsule Take 1 capsule (240 mg total) by mouth daily. 90 capsule 1  . esomeprazole (NEXIUM) 40 MG capsule Take 1 capsule (40 mg total) by mouth daily at 12 noon. 90 capsule 1  . glipiZIDE (GLUCOTROL) 5 MG tablet Take 0.5 tablets (2.5 mg total) by mouth daily before breakfast. 45 tablet 1  . LANTUS SOLOSTAR 100 UNIT/ML Solostar Pen Inject 30 Units into the skin at bedtime.   6  . pravastatin (PRAVACHOL) 20 MG tablet Take 1 tablet (20 mg total) by mouth daily. 90 tablet 1  . sitaGLIPtin (JANUVIA) 50 MG tablet Take 1 tablet (50 mg total) by mouth daily. 90 tablet 1  . sucralfate (CARAFATE) 1 G tablet   2  . telmisartan-hydrochlorothiazide  (MICARDIS HCT) 80-25 MG per tablet Take 1 tablet by mouth daily. 90 tablet 1  . traMADol (ULTRAM) 50 MG tablet TAKE 1 TABLET BY MOUTH EVERY 8 HOURS AS NEEDED 90 tablet 2   No current facility-administered medications on file prior to visit.   Past Medical History  Diagnosis Date  . Arthritis   . Asthma   . COPD (chronic obstructive pulmonary disease) ([place])   . Diabetes mellitus without complication ([place])   . Hypertension   . Sleep apnea       Review of Systems  HENT: Positive for postnasal drip. Negative for ear discharge, ear pain, sinus pressure, sore throat and trouble swallowing.   Respiratory: Negative.   Cardiovascular: Negative.   Genitourinary: Negative.   Psychiatric/Behavioral: Negative for suicidal ideas and self-injury. The patient is not nervous/anxious.   All other systems reviewed and are negative.  Filed Vitals:   11/17/14 0858  BP: 123/77  Pulse: 69  Temp: 97.9 F (36.6 C)  TempSrc: Oral  Weight: 309 lb 14.4 oz (140.57 kg)       Objective:   Physical Exam  Constitutional: She appears well-developed. No distress.  HENT:  Head: Normocephalic.  Left Ear: Tympanic membrane and ear canal normal.  Ears:  Nose: Nose normal.  Mouth/Throat: Uvula is midline, oropharynx  is clear and moist and mucous membranes are normal.  Cardiovascular: Normal rate, regular rhythm, normal heart sounds and intact distal pulses.   No murmur heard. Pulmonary/Chest: Effort normal and breath sounds normal. No respiratory distress. She has no wheezes.  Abdominal: Soft. Bowel sounds are normal. There is no tenderness.  Psychiatric: She has a normal mood and affect. Her behavior is normal. Judgment and thought content normal.  Nursing note and vitals reviewed.      Assessment:     Bipolar  Post nasal drip Cerumen impaction    Plan:     Check problem list.

## 2014-11-17 NOTE — Assessment & Plan Note (Signed)
Likely contributing to her reduced hearing. Right ear lavage done today. Patient felt better after lavage. Routine home ear hygiene recommended. F/U as needed.

## 2014-11-17 NOTE — Patient Instructions (Signed)
It was nice seeing you today, I am glad you are gradually doing better with your mood. Please increase your Abilify to 20 mg daily, i.e 2 tablets of the 10 mg Abilify. I will send a new prescription to your pharmacy for pick up once you run out of your medicine. Please see me back in 2 months. Call if you have any question.

## 2014-11-17 NOTE — Assessment & Plan Note (Signed)
Likely due to allergic rhinitis. Complicated by intermittent voice change/hoarseness. Flonase prescribed.

## 2014-11-17 NOTE — Assessment & Plan Note (Signed)
Gradually improving but not yet at baseline. I recommended increasing the dose of her Abilify to 20 mg qd. She agreed with plan. F/U with me in 2 months, Continue Psychology counseling with Dr Gwenlyn Saran.

## 2014-11-23 ENCOUNTER — Emergency Department (HOSPITAL_COMMUNITY)
Admission: EM | Admit: 2014-11-23 | Discharge: 2014-11-23 | Disposition: A | Discharge status: Home / Self Care | Payer: Medicare Other | Attending: Emergency Medicine | Admitting: Emergency Medicine

## 2014-11-23 ENCOUNTER — Encounter (HOSPITAL_COMMUNITY): Payer: Self-pay | Admitting: *Deleted

## 2014-11-23 DIAGNOSIS — Z87891 Personal history of nicotine dependence: Secondary | ICD-10-CM | POA: Diagnosis not present

## 2014-11-23 DIAGNOSIS — E119 Type 2 diabetes mellitus without complications: Secondary | ICD-10-CM | POA: Insufficient documentation

## 2014-11-23 DIAGNOSIS — Z7951 Long term (current) use of inhaled steroids: Secondary | ICD-10-CM | POA: Diagnosis not present

## 2014-11-23 DIAGNOSIS — S3992XA Unspecified injury of lower back, initial encounter: Secondary | ICD-10-CM | POA: Diagnosis not present

## 2014-11-23 DIAGNOSIS — J449 Chronic obstructive pulmonary disease, unspecified: Secondary | ICD-10-CM | POA: Diagnosis not present

## 2014-11-23 DIAGNOSIS — Y92410 Unspecified street and highway as the place of occurrence of the external cause: Secondary | ICD-10-CM | POA: Insufficient documentation

## 2014-11-23 DIAGNOSIS — Z79899 Other long term (current) drug therapy: Secondary | ICD-10-CM | POA: Diagnosis not present

## 2014-11-23 DIAGNOSIS — Y9389 Activity, other specified: Secondary | ICD-10-CM | POA: Diagnosis not present

## 2014-11-23 DIAGNOSIS — Z8669 Personal history of other diseases of the nervous system and sense organs: Secondary | ICD-10-CM | POA: Insufficient documentation

## 2014-11-23 DIAGNOSIS — M1990 Unspecified osteoarthritis, unspecified site: Secondary | ICD-10-CM | POA: Diagnosis not present

## 2014-11-23 DIAGNOSIS — S4991XA Unspecified injury of right shoulder and upper arm, initial encounter: Secondary | ICD-10-CM | POA: Diagnosis not present

## 2014-11-23 DIAGNOSIS — I10 Essential (primary) hypertension: Secondary | ICD-10-CM | POA: Diagnosis not present

## 2014-11-23 DIAGNOSIS — Y998 Other external cause status: Secondary | ICD-10-CM | POA: Insufficient documentation

## 2014-11-23 DIAGNOSIS — M791 Myalgia: Secondary | ICD-10-CM | POA: Diagnosis not present

## 2014-11-23 DIAGNOSIS — M7918 Myalgia, other site: Secondary | ICD-10-CM

## 2014-11-23 MED ORDER — METHOCARBAMOL 500 MG PO TABS: 500.0000 mg | ORAL_TABLET | Freq: Two times a day (BID) | ORAL | Status: DC

## 2014-11-23 MED ORDER — NAPROXEN 500 MG PO TABS
500.0000 mg | ORAL_TABLET | Freq: Two times a day (BID) | ORAL | Status: DC
Start: 2014-11-23 — End: 2014-12-25

## 2014-11-23 MED ORDER — OXYCODONE-ACETAMINOPHEN 5-325 MG PO TABS: 2.0000 | ORAL_TABLET | ORAL | Status: DC | PRN

## 2014-11-23 MED ORDER — KETOROLAC TROMETHAMINE 60 MG/2ML IM SOLN
60.0000 mg | Freq: Once | INTRAMUSCULAR | Status: AC
  Administered 2014-11-23: 60 mg via INTRAMUSCULAR
  Filled 2014-11-23: qty 2
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
## 2014-11-26 ENCOUNTER — Other Ambulatory Visit: Payer: Self-pay | Admitting: Family Medicine

## 2014-11-26 NOTE — Telephone Encounter (Signed)
Prescription called in by me to the pharmacy.

## 2014-11-26 NOTE — Telephone Encounter (Signed)
I called in refilled to the pharmacy and spoke with Lattie Haw (Pharmacy.

## 2014-11-30 ENCOUNTER — Ambulatory Visit (INDEPENDENT_AMBULATORY_CARE_PROVIDER_SITE_OTHER): Payer: Medicare Other | Admitting: Family Medicine

## 2014-11-30 ENCOUNTER — Encounter: Payer: Self-pay | Admitting: Family Medicine

## 2014-11-30 VITALS — BP 139/92 | HR 82 | Temp 98.0°F | Wt 314.0 lb

## 2014-11-30 DIAGNOSIS — M545 Low back pain, unspecified: Secondary | ICD-10-CM

## 2014-11-30 MED ORDER — CYCLOBENZAPRINE HCL 10 MG PO TABS: 10.0000 mg | ORAL_TABLET | Freq: Three times a day (TID) | ORAL | Status: DC | PRN
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
## 2014-11-30 NOTE — Patient Instructions (Addendum)
Try the flexeril (cyclobenazaprine) at night only. This is a muscle relaxer that can help put you to sleep as well. Do not drive after taking the medicine  Avoid bed rest like you have been doing. Try to stay active but not push it too far.  With the naproxen, take it with foods and make sure you take your nexium. Add a tums if you are still having an upset stomach.

## 2014-12-04 DIAGNOSIS — M19071 Primary osteoarthritis, right ankle and foot: Secondary | ICD-10-CM | POA: Diagnosis not present

## 2014-12-08 ENCOUNTER — Telehealth: Payer: Self-pay | Admitting: Psychology

## 2014-12-08 ENCOUNTER — Other Ambulatory Visit: Payer: Self-pay | Admitting: Family Medicine

## 2014-12-08 NOTE — Telephone Encounter (Signed)
Patient thought she had missed her [place] appointment.  It is tomorrow at 9:30.  She said she would attend.

## 2014-12-09 ENCOUNTER — Other Ambulatory Visit: Payer: Self-pay | Admitting: Family Medicine

## 2014-12-09 ENCOUNTER — Telehealth: Payer: Self-pay | Admitting: Family Medicine

## 2014-12-09 ENCOUNTER — Ambulatory Visit (INDEPENDENT_AMBULATORY_CARE_PROVIDER_SITE_OTHER): Payer: Medicare Other | Admitting: Psychology

## 2014-12-09 DIAGNOSIS — F39 Unspecified mood [affective] disorder: Secondary | ICD-10-CM | POA: Diagnosis not present

## 2014-12-09 MED ORDER — ARIPIPRAZOLE 20 MG PO TABS: 20.0000 mg | ORAL_TABLET | Freq: Every day | ORAL | Status: DC
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
## 2014-12-09 NOTE — Telephone Encounter (Signed)
Printed out letter stating she was clear to return to all work/activities as of the date I saw her, 11/7. Called and made sure this letter was okay, patient stated it was and would come pick it up tomorrow. Letter left at front office. -Dr. Lamar Benes

## 2014-12-09 NOTE — Patient Instructions (Addendum)
Please make a follow-up appointment for:  January 27, 2015 at 9:30. No changes in medicine today.  Keep up with the Abilify and we will check back with you after the holidays.  Feel free to contact us in between if you need [place]:  [phone]. Keep in mind that you may not need the Klonopin as much as you think you do.  You are getting better and better at being able to manage your feelings and make good choices.   Sometimes when people feel bad around the holidays, doing something for someone else can help you feel better.  We gave you information for the [facility] and the application for reduced fees.  I think your water aerobics idea is a great one.

## 2014-12-09 NOTE — Assessment & Plan Note (Signed)
Report of mood is euthymic.  Affect is consistent.  Thoughts are clear and goal directed.  She is interested in increasing to 30 mg of Abilify at some point but is not ready to do that today.  She has taken 11 Klonopin in 13 days.  Discussed the possibility that she will need to use it less because she has better mood control and will be able to make good decisions when she is less emotionally charged.    She thinks her appetite has increased with the Abilify.  Will need to monitor.  She reports she is walking and would like to do water aerobics.  This will also help with her not having anything to do during the day.  See patient instructions for further plan.
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
## 2014-12-09 NOTE — Telephone Encounter (Signed)
Will forward to MD. Jazmin Hartsell,CMA  

## 2014-12-09 NOTE — Progress Notes (Signed)
Patient presented for follow-up.  She reports she has been taking 20 mg of Abilify for several weeks and noticed better energy (most noticeable in the morning), less depression, and her "crying spells are gone."  In addition to Abilify, she reports taking Klonopin - usually at night if her mind won't slow down and occasionally during the day if she confronts something very stressful (e.g. An argument with her daughter).  Discussed medication and overall mood control.
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
## 2014-12-10 ENCOUNTER — Telehealth: Payer: Self-pay | Admitting: Family Medicine

## 2014-12-10 ENCOUNTER — Other Ambulatory Visit: Payer: Self-pay | Admitting: Family Medicine

## 2014-12-10 MED ORDER — MOMETASONE FUROATE 50 MCG/ACT NA SUSP: 2.0000 | Freq: Every day | NASAL | Status: DC

## 2014-12-10 NOTE — Telephone Encounter (Signed)
Spoke with patient and she states that her flonase isn't working well for her.  States that she is using her inhalers and breathing treatments as needed for her cough.  States that she has brown mucus coming up and has had some bleeding when she blows her nose.  Would like to know if something can be called in or does she need to come in and see you?  Jazmin Hartsell,CMA

## 2014-12-10 NOTE — Telephone Encounter (Signed)
Pt returned call and has been informed. Sadie Reynolds, ASA ° °

## 2014-12-10 NOTE — Telephone Encounter (Signed)
Pt called and said that she was given a new medication and she isn't sure if she is suppose to get refills on this or stop . Patient would like to speak to the nurse about this. jw

## 2014-12-10 NOTE — Telephone Encounter (Signed)
I will call something in to her pharm.

## 2014-12-10 NOTE — Telephone Encounter (Signed)
LM for patient with message from MD.  Jazmin Hartsell,CMA ° °

## 2014-12-11 ENCOUNTER — Other Ambulatory Visit: Payer: Self-pay | Admitting: *Deleted

## 2014-12-11 MED ORDER — BD PEN NEEDLE SHORT U/F 31G X 8 MM MISC: 1.0000 | Freq: Every day | Status: DC | PRN

## 2014-12-14 ENCOUNTER — Telehealth: Payer: Self-pay | Admitting: Family Medicine

## 2014-12-14 NOTE — Telephone Encounter (Signed)
Needs a cough syrup.  Is coughing up green stuff

## 2014-12-14 NOTE — Telephone Encounter (Signed)
Will forward to MD to see if something can be called in for patient or will she need to be seen now. Jazmin Hartsell,CMA

## 2014-12-14 NOTE — Telephone Encounter (Signed)
Most cough syrups are OTC except for hycodan group which can not be e-prescribed. She will need to come pick it up. If not doing well please advise her to come in to be seen.

## 2014-12-15 NOTE — Telephone Encounter (Signed)
Patient decided to wait until her upcoming appt to discuss this. Jazmin Hartsell,CMA

## 2014-12-22 ENCOUNTER — Ambulatory Visit: Payer: Medicare Other | Admitting: Family Medicine

## 2014-12-25 ENCOUNTER — Ambulatory Visit (INDEPENDENT_AMBULATORY_CARE_PROVIDER_SITE_OTHER): Payer: Medicare Other | Admitting: Family Medicine

## 2014-12-25 ENCOUNTER — Encounter: Payer: Self-pay | Admitting: Family Medicine

## 2014-12-25 VITALS — BP 152/85 | HR 94 | Temp 97.6°F | Wt 311.0 lb

## 2014-12-25 DIAGNOSIS — E1169 Type 2 diabetes mellitus with other specified complication: Secondary | ICD-10-CM

## 2014-12-25 DIAGNOSIS — E669 Obesity, unspecified: Secondary | ICD-10-CM

## 2014-12-25 DIAGNOSIS — J449 Chronic obstructive pulmonary disease, unspecified: Secondary | ICD-10-CM | POA: Diagnosis not present

## 2014-12-25 DIAGNOSIS — E119 Type 2 diabetes mellitus without complications: Secondary | ICD-10-CM

## 2014-12-25 DIAGNOSIS — F3181 Bipolar II disorder: Secondary | ICD-10-CM

## 2014-12-25 DIAGNOSIS — N950 Postmenopausal bleeding: Secondary | ICD-10-CM | POA: Insufficient documentation

## 2014-12-25 DIAGNOSIS — M25531 Pain in right wrist: Secondary | ICD-10-CM

## 2014-12-25 DIAGNOSIS — I10 Essential (primary) hypertension: Secondary | ICD-10-CM | POA: Diagnosis not present

## 2014-12-25 MED ORDER — SITAGLIPTIN PHOSPHATE 50 MG PO TABS: 50.0000 mg | ORAL_TABLET | Freq: Every day | ORAL | Status: DC

## 2014-12-25 MED ORDER — PRAVASTATIN SODIUM 20 MG PO TABS: 20.0000 mg | ORAL_TABLET | Freq: Every day | ORAL | Status: DC

## 2014-12-25 MED ORDER — ALBUTEROL SULFATE HFA 108 (90 BASE) MCG/ACT IN AERS: 2.0000 | INHALATION_SPRAY | Freq: Four times a day (QID) | RESPIRATORY_TRACT | Status: DC | PRN

## 2014-12-25 MED ORDER — DILTIAZEM HCL ER BEADS 240 MG PO CP24: 240.0000 mg | ORAL_CAPSULE | Freq: Every day | ORAL | Status: DC

## 2014-12-25 MED ORDER — TELMISARTAN-HCTZ 80-25 MG PO TABS: 1.0000 | ORAL_TABLET | Freq: Every day | ORAL | Status: DC

## 2014-12-25 MED ORDER — ADVAIR DISKUS 250-50 MCG/DOSE IN AEPB: 1.0000 | INHALATION_SPRAY | Freq: Two times a day (BID) | RESPIRATORY_TRACT | Status: DC

## 2014-12-25 MED ORDER — LANTUS SOLOSTAR 100 UNIT/ML ~~LOC~~ SOPN: 26.0000 [IU] | PEN_INJECTOR | Freq: Every day | SUBCUTANEOUS | Status: DC

## 2014-12-25 NOTE — Patient Instructions (Signed)
  Please schedule appointment for gynecology clinic  Postmenopausal Bleeding Postmenopausal bleeding is any bleeding after menopause. Menopause is when a woman's period stops. Any type of bleeding after menopause is concerning. It should be checked by your doctor. Any treatment will depend on the cause. HOME CARE Watch your condition for any changes.  Avoid the use of tampons and douches as told by your doctor.  Change your pads often.  Get regular pelvic exams and Pap tests.  Keep all appointments for tests as told by your doctor. GET HELP IF:  Your bleeding lasts for more than 1 week.  You have belly (abdominal) pain.  You have bleeding after sex (intercourse). GET HELP RIGHT AWAY IF:   You have a fever, chills, a headache, dizziness, muscle aches, and bleeding.  You have strong pain with bleeding.  You have clumps of blood (blood clots) coming from your vagina.  You have bleeding and need more than 1 pad an hour.  You feel like you are going to pass out (faint). MAKE SURE YOU:  Understand these instructions.  Will watch your condition.  Will get help right away if you are not doing well or get worse.   This information is not intended to replace advice given to you by your health care provider. Make sure you discuss any questions you have with your health care provider.   Document Released: 10/19/2007 Document Revised: 01/14/2013 Document Reviewed: 08/08/2012 Elsevier Interactive Patient Education [employer].

## 2014-12-25 NOTE — Progress Notes (Signed)
Subjective:     Patient ID: Kristina Hendricks, female   DOB: [birth year], 54 y.o.   MRN: [id number]  HPI HTN/DM2: She is compliant with all her meds, she is here for follow-up and medication refill. Denies any concern. Her CBG has been running fine. Bipolar: Patient stated she does not take her Klonopin as much, she does not want to get it refilled. She stated she is doing very well on Abilify 20 mg. She denies any anxiety, depression has improved, not suicidal. Feeling well in general. COPD: Patient stated she need refills of all her inhalers, otherwise doing well. Wrist pain: C/O right wrist pain for many years. It is worsened by cold weather. About 30 years ago,she fell on an open glass bottle container and she got her ligament cut. She had surgery then. Since then she has been having pain and joint stiffness. She was 16 at the time. Tramadol helps some. She will like to increase the dose of her Tramadol. Menstrual period: C/O bleeding after menopause 6 months ago. She started spotting 2 months ago. She is worried about this. She denies any other concern.  Current Outpatient Prescriptions on File Prior to Visit  Medication Sig Dispense Refill  . ADVAIR DISKUS 250-50 MCG/DOSE AEPB   5  . albuterol (PROVENTIL HFA;VENTOLIN HFA) 108 (90 BASE) MCG/ACT inhaler Inhale 2 puffs into the lungs every 6 (six) hours as needed for wheezing or shortness of breath. (Patient not taking: Reported on 11/10/2014) 18 g 4  . ARIPiprazole (ABILIFY) 20 MG tablet Take 1 tablet (20 mg total) by mouth daily. 30 tablet 3  . B-D ULTRAFINE III SHORT PEN 31G X 8 MM MISC Inject 1 applicator into the skin daily as needed. Use daily to inject insulin as instructed 100 each 4  . clonazePAM (KLONOPIN) 0.5 MG tablet Take 1 tablet (0.5 mg total) by mouth daily as needed for anxiety. 30 tablet 1  . cyclobenzaprine (FLEXERIL) 10 MG tablet Take 1 tablet (10 mg total) by mouth 3 (three) times daily as needed for muscle spasms. 30 tablet 0    . diltiazem (TIAZAC) 240 MG 24 hr capsule Take 1 capsule (240 mg total) by mouth daily. 90 capsule 1  . esomeprazole (NEXIUM) 40 MG capsule Take 1 capsule (40 mg total) by mouth daily at 12 noon. 90 capsule 1  . fluticasone (FLONASE) 50 MCG/ACT nasal spray Place 2 sprays into both nostrils daily. 16 g 6  . glipiZIDE (GLUCOTROL) 5 MG tablet Take 0.5 tablets (2.5 mg total) by mouth daily before breakfast. 45 tablet 1  . LANTUS SOLOSTAR 100 UNIT/ML Solostar Pen Inject 30 Units into the skin at bedtime.   6  . mometasone (NASONEX) 50 MCG/ACT nasal spray Place 2 sprays into the nose daily. 17 g 5  . naproxen (NAPROSYN) 500 MG tablet Take 1 tablet (500 mg total) by mouth 2 (two) times daily. 30 tablet 0  . oxyCODONE-acetaminophen (PERCOCET/ROXICET) 5-325 MG tablet Take 2 tablets by mouth every 4 (four) hours as needed for severe pain. 6 tablet 0  . pravastatin (PRAVACHOL) 20 MG tablet Take 1 tablet (20 mg total) by mouth daily. 90 tablet 1  . sitaGLIPtin (JANUVIA) 50 MG tablet Take 1 tablet (50 mg total) by mouth daily. 90 tablet 1  . sucralfate (CARAFATE) 1 G tablet   2  . telmisartan-hydrochlorothiazide (MICARDIS HCT) 80-25 MG per tablet Take 1 tablet by mouth daily. 90 tablet 1  . traMADol (ULTRAM) 50 MG tablet TAKE 1 TABLET BY MOUTH  EVERY 8 HOURS AS NEEDED FOR PAIN 90 tablet 2   No current facility-administered medications on file prior to visit.   Past Medical History  Diagnosis Date  . Arthritis   . Asthma   . COPD (chronic obstructive pulmonary disease) ([place])   . Diabetes mellitus without complication ([place])   . Hypertension   . Sleep apnea      Review of Systems  Constitutional: Negative for fatigue.  Respiratory: Negative.   Cardiovascular: Negative.   Gastrointestinal: Negative.   Genitourinary: Positive for menstrual problem.  Musculoskeletal:       Wrist pain  Psychiatric/Behavioral: Negative.  Negative for suicidal ideas, sleep disturbance and decreased concentration. The  patient is not nervous/anxious.   All other systems reviewed and are negative.  Filed Vitals:   12/25/14 0847  BP: 152/85  Pulse: 94  Temp: 97.6 F (36.4 C)  TempSrc: Oral  Weight: 311 lb (141.069 kg)       Objective:   Physical Exam  Constitutional: She is oriented to person, place, and time. She appears well-developed. No distress.  Cardiovascular: Normal rate, regular rhythm, normal heart sounds and intact distal pulses.   No murmur heard. Pulmonary/Chest: Effort normal and breath sounds normal. No respiratory distress. She has no wheezes.  Abdominal: Soft. Bowel sounds are normal. She exhibits no distension and no mass. There is no tenderness.  Musculoskeletal: Normal range of motion. She exhibits no edema.       Right wrist: Normal.       Left wrist: Normal.       Arms: Neurological: She is alert and oriented to person, place, and time. No cranial nerve deficit.  Psychiatric: She has a normal mood and affect. Her behavior is normal. Judgment and thought content normal.  Nursing note and vitals reviewed.      Assessment:     HTN DM2 Bipolar COPD Wrist pain Postmenopausal bleeding     Plan:     Check problem list.

## 2014-12-25 NOTE — Assessment & Plan Note (Signed)
No change in regimen. Seems to be doing well. We will recheck A1C in Jan.

## 2014-12-25 NOTE — Assessment & Plan Note (Signed)
Chronic. Likely arthritis setting in. May take Tramadol 1-2 tablet daily. I will give refills when needed. Consider imaging in the future.

## 2014-12-25 NOTE — Assessment & Plan Note (Signed)
Medication list reviewed. Unlikely related to medication. Previously she had amenorrhea after initiating Harvoni. I wonder if she is now regaining her menstrual cycle after stopping the medication. Based on her age I recommended referral to [facility] for EB. Return precaution discussed.

## 2014-12-25 NOTE — Assessment & Plan Note (Signed)
Stable on albuterol as needed and Advair. Refills given.

## 2014-12-25 NOTE — Assessment & Plan Note (Signed)
BP looks good. Her medications reviewed with her today. She is compliant with meds. Refills given.

## 2014-12-25 NOTE — Assessment & Plan Note (Signed)
Great improvement with Abilify 20 mg qd. I don't feel the need to increase her dose now since she is doing real good. As discussed with her, if she is not needing the Klonopin she does not need to take it. I will keep it on her medication list for now.

## 2015-01-19 ENCOUNTER — Other Ambulatory Visit: Payer: Self-pay | Admitting: Family Medicine

## 2015-01-22 ENCOUNTER — Other Ambulatory Visit: Payer: Self-pay | Admitting: Family Medicine

## 2015-01-26 ENCOUNTER — Other Ambulatory Visit: Payer: Self-pay | Admitting: Family Medicine

## 2015-01-26 MED ORDER — TELMISARTAN-HCTZ 80-25 MG PO TABS: 1.0000 | ORAL_TABLET | Freq: Every day | ORAL | Status: DC

## 2015-01-26 NOTE — Telephone Encounter (Signed)
Needs refill on high blood pressure pill. Doesn't know its name. She has HCTZ.  [facility] on the corner off [address]

## 2015-01-27 ENCOUNTER — Ambulatory Visit: Payer: Medicare Other | Admitting: Psychology

## 2015-01-27 ENCOUNTER — Telehealth: Payer: Self-pay | Admitting: Psychology

## 2015-01-27 ENCOUNTER — Other Ambulatory Visit: Payer: Self-pay | Admitting: Family Medicine

## 2015-01-27 MED ORDER — ARIPIPRAZOLE 30 MG PO TABS: 30.0000 mg | ORAL_TABLET | Freq: Every day | ORAL | Status: DC

## 2015-01-27 NOTE — Telephone Encounter (Addendum)
I spoke with patient, she alluded to Dr. Tod Persia documentation below. He depression worsened due to the amount of bills she is getting this year from different sources, she has outspent her disability check. I discuss the issue of increasing her Ability and she stated she is interested in trying a higher dose. Since she had been on this in this past with no S/E or intolerance, I will go ahead and change her dose. She is currently not suicidal. I advised she call 911 or come to our clinic in case she is having suicidal ideation. Return precautions discussed. All questions were answered.

## 2015-01-27 NOTE — Telephone Encounter (Signed)
   I spoke with patient, she alluded to Dr. Tod Persia documentation below. He depression worsened due to the amount of bills she is getting this year from different sources, she has outspent her disability check. I discuss the issue of increasing her Ability and she stated she is interested in trying a higher dose. Since she had been on this in this past with no S/E or intolerance, I will go ahead and change her dose. She is currently not suicidal. I advised she call 911 or come to our clinic in case she is having suicidal ideation. Return precautions discussed. All questions were answered.

## 2015-01-27 NOTE — Telephone Encounter (Signed)
Ms. Schweighardt was unable to attend her appointment today secondary to cost.  Her copay is $25 and she had some unexpected bills.    She reports feeling more depressed since some changes in her financial situation.  Her last appointment with Dr. Gwendlyn Deutscher, on 12/2, she reported she was doing very well on Abilify 20 mg.  Today she wishes to increase to 30 mg.  This is a dose that she has been on before and tolerated well per her report.    Report of symptoms today include depressed mood and more importantly, visual hallucinations and a sense of being bothered by some spiritual beings (vague report here).  Denies suicidal or homicidal ideation.  Denies command hallucinations.    Dr. Tammi Klippel thought increasing to 30 mg of Abilify is reasonable as long as she can be seen in the next week or two.  She has a close relationship with Dr. Gwendlyn Deutscher and needs to follow-up with her regarding some postmenopausal bleeding.  Being sensitive to her financial situation, we offered an appointment with [place] in [facility] or to see Dr. Gwendlyn Deutscher for follow-up.  She elected to follow-up with Dr. Gwendlyn Deutscher so she needs to follow-up anyway.  I scheduled that appointment for her for 1/13 at 8:45.  We expect her to be tolerating the increase in Abilify fine and am hoping that there is an increase in efficacy.    She should call to schedule for [place] after her appointment with Dr. Gwendlyn Deutscher.  [phone] to schedule.

## 2015-01-28 ENCOUNTER — Other Ambulatory Visit: Payer: Self-pay | Admitting: *Deleted

## 2015-01-28 NOTE — Telephone Encounter (Signed)
Received a refill request for One Touch Ultra Blue Test strips.  Fax refill request form signed by Dr. Gwendlyn Deutscher; test three times daily, #100, 3 refills.  Form faxed to [facility].  Derl Barrow, RN

## 2015-01-29 ENCOUNTER — Telehealth: Payer: Self-pay | Admitting: Family Medicine

## 2015-01-29 MED ORDER — TRAMADOL HCL 50 MG PO TABS: ORAL_TABLET | ORAL | Status: DC

## 2015-01-29 NOTE — Telephone Encounter (Signed)
Pt called because t he pharmacy is going by an old prescription of her Tramadol, It was changed to 120 qty but t hey pharmacy is saying 21 qty. She would like some to call and get this straight. jw

## 2015-01-29 NOTE — Telephone Encounter (Signed)
I will call her pharm to clarify. Thanks.

## 2015-01-29 NOTE — Telephone Encounter (Signed)
Tramadol refill adjusted and called in to the pharmacy.

## 2015-01-29 NOTE — Telephone Encounter (Signed)
Will forward to MD to confirm most recent dosage. Shawnie Nicole,CMA

## 2015-02-05 ENCOUNTER — Ambulatory Visit: Payer: Medicare Other | Admitting: Family Medicine

## 2015-03-03 ENCOUNTER — Encounter (HOSPITAL_COMMUNITY): Payer: Self-pay

## 2015-03-03 ENCOUNTER — Emergency Department (HOSPITAL_COMMUNITY): Payer: Medicare Other

## 2015-03-03 ENCOUNTER — Emergency Department (HOSPITAL_COMMUNITY)
Admission: EM | Admit: 2015-03-03 | Discharge: 2015-03-03 | Disposition: A | Discharge status: Home / Self Care | Payer: Medicare Other | Attending: Emergency Medicine | Admitting: Emergency Medicine

## 2015-03-03 DIAGNOSIS — Z7951 Long term (current) use of inhaled steroids: Secondary | ICD-10-CM | POA: Insufficient documentation

## 2015-03-03 DIAGNOSIS — J449 Chronic obstructive pulmonary disease, unspecified: Secondary | ICD-10-CM | POA: Insufficient documentation

## 2015-03-03 DIAGNOSIS — Z7984 Long term (current) use of oral hypoglycemic drugs: Secondary | ICD-10-CM | POA: Insufficient documentation

## 2015-03-03 DIAGNOSIS — Z7982 Long term (current) use of aspirin: Secondary | ICD-10-CM | POA: Diagnosis not present

## 2015-03-03 DIAGNOSIS — E119 Type 2 diabetes mellitus without complications: Secondary | ICD-10-CM | POA: Diagnosis not present

## 2015-03-03 DIAGNOSIS — M62838 Other muscle spasm: Secondary | ICD-10-CM | POA: Diagnosis not present

## 2015-03-03 DIAGNOSIS — M25571 Pain in right ankle and joints of right foot: Secondary | ICD-10-CM | POA: Insufficient documentation

## 2015-03-03 DIAGNOSIS — Z9889 Other specified postprocedural states: Secondary | ICD-10-CM | POA: Insufficient documentation

## 2015-03-03 DIAGNOSIS — Z87891 Personal history of nicotine dependence: Secondary | ICD-10-CM | POA: Insufficient documentation

## 2015-03-03 DIAGNOSIS — Z87828 Personal history of other (healed) physical injury and trauma: Secondary | ICD-10-CM | POA: Insufficient documentation

## 2015-03-03 DIAGNOSIS — Z8669 Personal history of other diseases of the nervous system and sense organs: Secondary | ICD-10-CM | POA: Diagnosis not present

## 2015-03-03 DIAGNOSIS — M1990 Unspecified osteoarthritis, unspecified site: Secondary | ICD-10-CM | POA: Diagnosis not present

## 2015-03-03 DIAGNOSIS — M7989 Other specified soft tissue disorders: Secondary | ICD-10-CM | POA: Diagnosis not present

## 2015-03-03 DIAGNOSIS — Z79899 Other long term (current) drug therapy: Secondary | ICD-10-CM | POA: Insufficient documentation

## 2015-03-03 DIAGNOSIS — R200 Anesthesia of skin: Secondary | ICD-10-CM | POA: Diagnosis present

## 2015-03-03 MED ORDER — DIAZEPAM 5 MG PO TABS
5.0000 mg | ORAL_TABLET | Freq: Once | ORAL | Status: AC
  Administered 2015-03-03: 5 mg via ORAL
  Filled 2015-03-03: qty 1

## 2015-03-03 MED ORDER — ACETAMINOPHEN 500 MG PO TABS
1000.0000 mg | ORAL_TABLET | Freq: Once | ORAL | Status: AC
  Administered 2015-03-03: 1000 mg via ORAL
  Filled 2015-03-03: qty 2

## 2015-03-03 MED ORDER — OXYCODONE HCL 5 MG PO TABS
5.0000 mg | ORAL_TABLET | Freq: Once | ORAL | Status: AC
  Administered 2015-03-03: 5 mg via ORAL
  Filled 2015-03-03: qty 1

## 2015-03-03 MED ORDER — IBUPROFEN 800 MG PO TABS
800.0000 mg | ORAL_TABLET | Freq: Once | ORAL | Status: AC
  Administered 2015-03-03: 800 mg via ORAL
  Filled 2015-03-03: qty 1

## 2015-03-03 NOTE — ED Provider Notes (Signed)
CSN: [id number]     Arrival date & time 03/03/15  0846 History   First MD Initiated Contact with Patient 03/03/15 0900     Chief Complaint  Patient presents with  . Leg Pain  . arm numbness      (Consider location/radiation/quality/duration/timing/severity/associated sxs/prior Treatment) Patient is a 55 y.o. female presenting with leg pain and general illness. The history is provided by the patient.  Leg Pain Associated symptoms: no fever   Illness Severity:  Moderate Onset quality:  Sudden Duration:  2 hours Timing:  Constant Progression:  Unchanged Chronicity:  New Associated symptoms: myalgias   Associated symptoms: no chest pain, no congestion, no fever, no headaches, no nausea, no rhinorrhea, no shortness of breath, no vomiting and no wheezing     55 yo F with a chief complaints of right ankle pain as well as right arm paresthesias. This been going on for a couple hours. Patient states that she rolled her ankle and this is a chronic issue for this patient. Has had prior ankle surgeries and was told that there is nothing else we can do for her ankle. Has chronic pain to the area and feels like it's worse. Patient also feeling that her right arm hurts from the neck all the way down into her hand. Denies any muscle weakness. Denies injury. Nothing seems to make this better or worse.  Past Medical History  Diagnosis Date  . Arthritis   . Asthma   . COPD (chronic obstructive pulmonary disease) ([place])   . Diabetes mellitus without complication ([place])   . Hypertension   . Sleep apnea    Past Surgical History  Procedure Laterality Date  . Cholecystectomy  2003  . Tubal ligation  1984   Family History  Problem Relation Age of Onset  . Alcohol abuse Mother   . Drug abuse Mother   . Cancer Mother   . Depression Mother   . Diabetes Mother   . Hypertension Mother   . Heart disease Father   . Depression Father   . Diabetes Father   . Hypertension Father    Social History   Substance Use Topics  . Smoking status: Former Smoker    Quit date: 10/24/2011  . Smokeless tobacco: Never Used  . Alcohol Use: No   OB History    No data available     Review of Systems  Constitutional: Negative for fever and chills.  HENT: Negative for congestion and rhinorrhea.   Eyes: Negative for redness and visual disturbance.  Respiratory: Negative for shortness of breath and wheezing.   Cardiovascular: Negative for chest pain and palpitations.  Gastrointestinal: Negative for nausea and vomiting.  Genitourinary: Negative for dysuria and urgency.  Musculoskeletal: Positive for myalgias and arthralgias.  Skin: Negative for pallor and wound.  Neurological: Negative for dizziness and headaches.      Allergies  Review of patient's allergies indicates no known allergies.  Home Medications   Prior to Admission medications   Medication Sig Start Date End Date Taking? Authorizing Provider  ADVAIR DISKUS 250-50 MCG/DOSE AEPB Inhale 1 puff into the lungs 2 (two) times daily. 12/25/14  Yes Kinnie Feil, MD  albuterol (PROVENTIL HFA;VENTOLIN HFA) 108 (90 BASE) MCG/ACT inhaler Inhale 2 puffs into the lungs every 6 (six) hours as needed for wheezing or shortness of breath. 12/25/14  Yes Kinnie Feil, MD  ARIPiprazole (ABILIFY) 30 MG tablet Take 1 tablet (30 mg total) by mouth daily. 01/27/15  Yes Kinnie Feil,  MD  aspirin (ASPIRIN CHILDRENS) 81 MG chewable tablet Chew by mouth daily.   Yes Historical Provider, MD  esomeprazole (NEXIUM) 40 MG capsule Take 1 capsule (40 mg total) by mouth daily at 12 noon. 07/29/14  Yes Kinnie Feil, MD  LANTUS SOLOSTAR 100 UNIT/ML Solostar Pen Inject 26 Units into the skin at bedtime. 12/25/14  Yes Kinnie Feil, MD  mometasone (NASONEX) 50 MCG/ACT nasal spray Place 2 sprays into the nose daily. 12/10/14  Yes Kinnie Feil, MD  pravastatin (PRAVACHOL) 20 MG tablet Take 1 tablet (20 mg total) by mouth daily. 12/25/14  Yes Kinnie Feil, MD  sitaGLIPtin (JANUVIA) 50 MG tablet Take 1 tablet (50 mg total) by mouth daily. 12/25/14  Yes Kinnie Feil, MD  TAZTIA XT 240 MG 24 hr capsule TAKE 1 CAPSULE BY MOUTH EVERY DAY 01/25/15  Yes Kinnie Feil, MD  telmisartan-hydrochlorothiazide (MICARDIS HCT) 80-25 MG tablet Take 1 tablet by mouth daily. 01/26/15  Yes Kinnie Feil, MD  traMADol (ULTRAM) 50 MG tablet 1-2 tab every 8 hours as needed for pain 01/29/15  Yes Kinnie Feil, MD  B-D ULTRAFINE III SHORT PEN 31G X 8 MM MISC Inject 1 applicator into the skin daily as needed. Use daily to inject insulin as instructed 12/11/14   Kinnie Feil, MD  clonazePAM (KLONOPIN) 0.5 MG tablet Take 1 tablet (0.5 mg total) by mouth daily as needed for anxiety. Patient not taking: Reported on 12/25/2014 10/29/14   Kinnie Feil, MD   BP 114/84 mmHg  Pulse 73  Temp(Src) 98 F (36.7 C) (Oral)  Resp 16  Ht 5\' 7"  (1.702 m)  SpO2 98% Physical Exam  Constitutional: She is oriented to person, place, and time. She appears well-developed and well-nourished. No distress.  HENT:  Head: Normocephalic and atraumatic.  Eyes: EOM are normal. Pupils are equal, round, and reactive to light.  Neck: Normal range of motion. Neck supple.  Cardiovascular: Normal rate and regular rhythm.  Exam reveals no gallop and no friction rub.   No murmur heard. Pulmonary/Chest: Effort normal. She has no wheezes. She has no rales.  Abdominal: Soft. She exhibits no distension. There is no tenderness. There is no rebound and no guarding.  Musculoskeletal: She exhibits tenderness. She exhibits no edema.  Tender palpation about the superior aspect of the trapezius muscle as it attaches to the base of the skull. Pulse motor and sensation is intact distally. Patient has 5 out of 5 muscle strength to the right upper extremity. Reflexes are intact.   Patient is also tender all around the ankle. Unable to pinpoint the exact location. All surgical scar noted. Pulse motor  and sensation intact to the right lower extremity.  Neurological: She is alert and oriented to person, place, and time.  Skin: Skin is warm and dry. She is not diaphoretic.  Psychiatric: She has a normal mood and affect. Her behavior is normal.  Nursing note and vitals reviewed.   ED Course  Procedures (including critical care time) Labs Review Labs Reviewed - No data to display  Imaging Review Dg Ankle Complete Right  03/03/2015  CLINICAL DATA:  Right ankle pain. EXAM: RIGHT ANKLE - COMPLETE 3+ VIEW COMPARISON:  09/29/2014 FINDINGS: There is no evidence of fracture, dislocation, or joint effusion. There are stable postsurgical/ posttraumatic changes of the distal right fibula and tibia, with associated osteopenia. 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 There is mild soft tissue edema. 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 No evidence of acute fracture or subluxation. Mild residual soft tissue swelling about the ankle. Electronically Signed   By: Fidela Salisbury M.D.   On: 03/03/2015 10:01   I have personally reviewed and evaluated these images and lab results as part of my medical decision-making.   EKG Interpretation None      MDM   Final diagnoses:  Right ankle pain  Trapezius muscle spasm    55 yo F with right arm paresthesias and right ankle pain. Patient denies injuring them in the fall. Pain is reproducible on palpation with spasm of the trapezius muscle. Suspect that her symptoms are secondary to that. Will obtain an x-ray to evaluate the right ankle. Patient is able to ambulate on it and  Doubt fracture.  X-rays for fracture. Will have the patient follow-up with her orthopedic surgeon.  2:32 PM:  I have discussed the diagnosis/risks/treatment options with the patient and believe the pt to be eligible for discharge home to  follow-up with Ortho. We also discussed returning to the ED immediately if new or worsening sx occur. We discussed the sx which are most concerning (e.g., sudden worsening pain, fever, inability to tolerate by mouth PCP) that necessitate immediate return. Medications administered to the patient during their visit and any new prescriptions provided to the patient are listed below.  Medications given during this visit Medications  acetaminophen (TYLENOL) tablet 1,000 mg (1,000 mg Oral Given 03/03/15 0924)  ibuprofen (ADVIL,MOTRIN) tablet 800 mg (800 mg Oral Given 03/03/15 0924)  diazepam (VALIUM) tablet 5 mg (5 mg Oral Given 03/03/15 0924)  oxyCODONE (Oxy IR/ROXICODONE) immediate release tablet 5 mg (5 mg Oral Given 03/03/15 [id number])    Discharge Medication List as of 03/03/2015 11:43 AM      The patient appears reasonably screen and/or stabilized for discharge and I doubt any other medical condition or other [facility] requiring further screening, evaluation, or treatment in the ED at this time prior to discharge.    Deno Etienne, DO 03/03/15 1432

## 2015-03-03 NOTE — ED Notes (Addendum)
Patient c/o right arm numbness and pain that started yesterday and patient also c/o left leg pain. Patient states she twisted her left leg yesterday and is continueing to have pain in that leg. Patiaent denies any CP or SOB. No injuries to right arm.

## 2015-03-03 NOTE — Discharge Instructions (Signed)

## 2015-03-12 ENCOUNTER — Ambulatory Visit: Payer: Medicare Other | Admitting: Family Medicine

## 2015-03-27 ENCOUNTER — Other Ambulatory Visit: Payer: Self-pay | Admitting: Family Medicine

## 2015-03-29 DIAGNOSIS — Z7984 Long term (current) use of oral hypoglycemic drugs: Secondary | ICD-10-CM | POA: Diagnosis not present

## 2015-03-29 DIAGNOSIS — S93411A Sprain of calcaneofibular ligament of right ankle, initial encounter: Secondary | ICD-10-CM | POA: Diagnosis not present

## 2015-03-29 DIAGNOSIS — S82831A Other fracture of upper and lower end of right fibula, initial encounter for closed fracture: Secondary | ICD-10-CM | POA: Diagnosis not present

## 2015-03-29 DIAGNOSIS — M7731 Calcaneal spur, right foot: Secondary | ICD-10-CM | POA: Diagnosis not present

## 2015-03-29 DIAGNOSIS — M25571 Pain in right ankle and joints of right foot: Secondary | ICD-10-CM | POA: Diagnosis not present

## 2015-03-29 DIAGNOSIS — I10 Essential (primary) hypertension: Secondary | ICD-10-CM | POA: Diagnosis not present

## 2015-03-29 DIAGNOSIS — J45909 Unspecified asthma, uncomplicated: Secondary | ICD-10-CM | POA: Diagnosis not present

## 2015-03-29 DIAGNOSIS — E119 Type 2 diabetes mellitus without complications: Secondary | ICD-10-CM | POA: Diagnosis not present

## 2015-03-29 DIAGNOSIS — R2241 Localized swelling, mass and lump, right lower limb: Secondary | ICD-10-CM | POA: Diagnosis not present

## 2015-04-03 ENCOUNTER — Emergency Department (HOSPITAL_COMMUNITY): Payer: No Typology Code available for payment source

## 2015-04-03 ENCOUNTER — Emergency Department (HOSPITAL_COMMUNITY)
Admission: EM | Admit: 2015-04-03 | Discharge: 2015-04-03 | Disposition: A | Discharge status: Home / Self Care | Payer: No Typology Code available for payment source | Attending: Emergency Medicine | Admitting: Emergency Medicine

## 2015-04-03 ENCOUNTER — Encounter (HOSPITAL_COMMUNITY): Payer: Self-pay | Admitting: *Deleted

## 2015-04-03 DIAGNOSIS — S81012A Laceration without foreign body, left knee, initial encounter: Secondary | ICD-10-CM

## 2015-04-03 DIAGNOSIS — Z7982 Long term (current) use of aspirin: Secondary | ICD-10-CM | POA: Diagnosis not present

## 2015-04-03 DIAGNOSIS — T148 Other injury of unspecified body region: Secondary | ICD-10-CM | POA: Insufficient documentation

## 2015-04-03 DIAGNOSIS — M25562 Pain in left knee: Secondary | ICD-10-CM | POA: Diagnosis not present

## 2015-04-03 DIAGNOSIS — I10 Essential (primary) hypertension: Secondary | ICD-10-CM | POA: Diagnosis not present

## 2015-04-03 DIAGNOSIS — J449 Chronic obstructive pulmonary disease, unspecified: Secondary | ICD-10-CM | POA: Insufficient documentation

## 2015-04-03 DIAGNOSIS — S8002XA Contusion of left knee, initial encounter: Secondary | ICD-10-CM | POA: Diagnosis not present

## 2015-04-03 DIAGNOSIS — E119 Type 2 diabetes mellitus without complications: Secondary | ICD-10-CM | POA: Diagnosis not present

## 2015-04-03 DIAGNOSIS — Z8669 Personal history of other diseases of the nervous system and sense organs: Secondary | ICD-10-CM | POA: Insufficient documentation

## 2015-04-03 DIAGNOSIS — Z87891 Personal history of nicotine dependence: Secondary | ICD-10-CM | POA: Insufficient documentation

## 2015-04-03 DIAGNOSIS — S29001A Unspecified injury of muscle and tendon of front wall of thorax, initial encounter: Secondary | ICD-10-CM | POA: Insufficient documentation

## 2015-04-03 DIAGNOSIS — T07XXXA Unspecified multiple injuries, initial encounter: Secondary | ICD-10-CM

## 2015-04-03 DIAGNOSIS — Z7984 Long term (current) use of oral hypoglycemic drugs: Secondary | ICD-10-CM | POA: Insufficient documentation

## 2015-04-03 DIAGNOSIS — M549 Dorsalgia, unspecified: Secondary | ICD-10-CM | POA: Diagnosis not present

## 2015-04-03 DIAGNOSIS — S8992XA Unspecified injury of left lower leg, initial encounter: Secondary | ICD-10-CM | POA: Diagnosis present

## 2015-04-03 DIAGNOSIS — R079 Chest pain, unspecified: Secondary | ICD-10-CM | POA: Diagnosis not present

## 2015-04-03 DIAGNOSIS — Z79899 Other long term (current) drug therapy: Secondary | ICD-10-CM | POA: Insufficient documentation

## 2015-04-03 DIAGNOSIS — Y9389 Activity, other specified: Secondary | ICD-10-CM | POA: Insufficient documentation

## 2015-04-03 DIAGNOSIS — M1990 Unspecified osteoarthritis, unspecified site: Secondary | ICD-10-CM | POA: Diagnosis not present

## 2015-04-03 DIAGNOSIS — Y998 Other external cause status: Secondary | ICD-10-CM | POA: Diagnosis not present

## 2015-04-03 DIAGNOSIS — Y92410 Unspecified street and highway as the place of occurrence of the external cause: Secondary | ICD-10-CM | POA: Diagnosis not present

## 2015-04-03 DIAGNOSIS — R52 Pain, unspecified: Secondary | ICD-10-CM

## 2015-04-03 DIAGNOSIS — R0602 Shortness of breath: Secondary | ICD-10-CM | POA: Diagnosis not present

## 2015-04-03 MED ORDER — HYDROCODONE-ACETAMINOPHEN 5-325 MG PO TABS
1.0000 | ORAL_TABLET | Freq: Once | ORAL | Status: AC
  Administered 2015-04-03: 1 via ORAL
  Filled 2015-04-03: qty 1

## 2015-04-03 MED ORDER — ONDANSETRON 4 MG PO TBDP
4.0000 mg | ORAL_TABLET | Freq: Once | ORAL | Status: AC
  Administered 2015-04-03: 4 mg via ORAL
  Filled 2015-04-03: qty 1

## 2015-04-03 MED ORDER — HYDROCODONE-ACETAMINOPHEN 5-325 MG PO TABS: 2.0000 | ORAL_TABLET | ORAL | Status: DC | PRN

## 2015-04-03 MED ORDER — LIDOCAINE-EPINEPHRINE 2 %-1:100000 IJ SOLN
20.0000 mL | Freq: Once | INTRAMUSCULAR | Status: DC
  Filled 2015-04-03: qty 1

## 2015-04-03 NOTE — ED Notes (Signed)
Patient transported to X-ray 

## 2015-04-03 NOTE — Discharge Instructions (Signed)

## 2015-04-03 NOTE — ED Notes (Signed)
Patient had a recent injury to her right ankle a week ago and is complaining of pain there too.
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
## 2015-04-06 ENCOUNTER — Ambulatory Visit: Payer: Medicare Other | Admitting: Family Medicine

## 2015-04-09 ENCOUNTER — Emergency Department (HOSPITAL_COMMUNITY)
Admission: EM | Admit: 2015-04-09 | Discharge: 2015-04-09 | Disposition: A | Discharge status: Home / Self Care | Payer: Medicare Other | Attending: Emergency Medicine | Admitting: Emergency Medicine

## 2015-04-09 ENCOUNTER — Encounter (HOSPITAL_COMMUNITY): Payer: Self-pay | Admitting: *Deleted

## 2015-04-09 ENCOUNTER — Emergency Department (HOSPITAL_COMMUNITY): Payer: Medicare Other

## 2015-04-09 DIAGNOSIS — Z79899 Other long term (current) drug therapy: Secondary | ICD-10-CM | POA: Insufficient documentation

## 2015-04-09 DIAGNOSIS — Z7951 Long term (current) use of inhaled steroids: Secondary | ICD-10-CM | POA: Diagnosis not present

## 2015-04-09 DIAGNOSIS — Z8669 Personal history of other diseases of the nervous system and sense organs: Secondary | ICD-10-CM | POA: Insufficient documentation

## 2015-04-09 DIAGNOSIS — J449 Chronic obstructive pulmonary disease, unspecified: Secondary | ICD-10-CM | POA: Insufficient documentation

## 2015-04-09 DIAGNOSIS — M25571 Pain in right ankle and joints of right foot: Secondary | ICD-10-CM | POA: Insufficient documentation

## 2015-04-09 DIAGNOSIS — E119 Type 2 diabetes mellitus without complications: Secondary | ICD-10-CM | POA: Diagnosis not present

## 2015-04-09 DIAGNOSIS — I10 Essential (primary) hypertension: Secondary | ICD-10-CM | POA: Insufficient documentation

## 2015-04-09 DIAGNOSIS — Z87891 Personal history of nicotine dependence: Secondary | ICD-10-CM | POA: Diagnosis not present

## 2015-04-09 DIAGNOSIS — Z7984 Long term (current) use of oral hypoglycemic drugs: Secondary | ICD-10-CM | POA: Diagnosis not present

## 2015-04-09 DIAGNOSIS — Z87828 Personal history of other (healed) physical injury and trauma: Secondary | ICD-10-CM | POA: Diagnosis not present

## 2015-04-09 DIAGNOSIS — M1990 Unspecified osteoarthritis, unspecified site: Secondary | ICD-10-CM | POA: Diagnosis not present

## 2015-04-09 DIAGNOSIS — Z7982 Long term (current) use of aspirin: Secondary | ICD-10-CM | POA: Insufficient documentation

## 2015-04-09 DIAGNOSIS — M25562 Pain in left knee: Secondary | ICD-10-CM | POA: Diagnosis not present

## 2015-04-09 MED ORDER — HYDROCODONE-ACETAMINOPHEN 5-325 MG PO TABS: 1.0000 | ORAL_TABLET | ORAL | Status: DC | PRN

## 2015-04-09 MED ORDER — HYDROCODONE-ACETAMINOPHEN 5-325 MG PO TABS
1.0000 | ORAL_TABLET | Freq: Once | ORAL | Status: AC
  Administered 2015-04-09: 1 via ORAL
  Filled 2015-04-09: qty 1

## 2015-04-09 MED ORDER — CEPHALEXIN 500 MG PO CAPS: 500.0000 mg | ORAL_CAPSULE | Freq: Three times a day (TID) | ORAL | Status: DC

## 2015-04-09 NOTE — Discharge Instructions (Signed)
Your x-ray showed a well-healing ankle fracture. Please follow up with your primary care as scheduled. Please follow up with orthopedics as soon as possible. Just in case, I gave you the contact info for Dr. Damita Dunnings office here in [place]. I will also give you a short term refill of your Vicodin given your continued severe pain. Please also remind your primary care provider that your stitches need to be removed two weeks from when they were placed. Return to the ER for new or worsening symptoms.
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
## 2015-04-13 ENCOUNTER — Ambulatory Visit (INDEPENDENT_AMBULATORY_CARE_PROVIDER_SITE_OTHER): Payer: Medicare Other | Admitting: Family Medicine

## 2015-04-13 ENCOUNTER — Encounter: Payer: Self-pay | Admitting: Family Medicine

## 2015-04-13 VITALS — BP 118/86 | HR 79 | Temp 97.6°F | Ht 67.0 in | Wt 315.0 lb

## 2015-04-13 DIAGNOSIS — F411 Generalized anxiety disorder: Secondary | ICD-10-CM

## 2015-04-13 DIAGNOSIS — E119 Type 2 diabetes mellitus without complications: Secondary | ICD-10-CM

## 2015-04-13 DIAGNOSIS — Z5189 Encounter for other specified aftercare: Secondary | ICD-10-CM | POA: Diagnosis not present

## 2015-04-13 DIAGNOSIS — M25562 Pain in left knee: Secondary | ICD-10-CM

## 2015-04-13 DIAGNOSIS — F3181 Bipolar II disorder: Secondary | ICD-10-CM

## 2015-04-13 DIAGNOSIS — I10 Essential (primary) hypertension: Secondary | ICD-10-CM

## 2015-04-13 DIAGNOSIS — Z794 Long term (current) use of insulin: Secondary | ICD-10-CM | POA: Diagnosis not present

## 2015-04-13 DIAGNOSIS — E1169 Type 2 diabetes mellitus with other specified complication: Secondary | ICD-10-CM

## 2015-04-13 DIAGNOSIS — M25571 Pain in right ankle and joints of right foot: Secondary | ICD-10-CM | POA: Diagnosis not present

## 2015-04-13 DIAGNOSIS — E669 Obesity, unspecified: Secondary | ICD-10-CM

## 2015-04-13 LAB — POCT GLYCOSYLATED HEMOGLOBIN (HGB A1C): HEMOGLOBIN A1C: 6.8

## 2015-04-13 MED ORDER — PRAVASTATIN SODIUM 20 MG PO TABS: 20.0000 mg | ORAL_TABLET | Freq: Every day | ORAL | Status: DC

## 2015-04-13 MED ORDER — TRAMADOL HCL 50 MG PO TABS: ORAL_TABLET | ORAL | Status: DC

## 2015-04-13 MED ORDER — TELMISARTAN-HCTZ 80-25 MG PO TABS: 1.0000 | ORAL_TABLET | Freq: Every day | ORAL | Status: DC

## 2015-04-13 MED ORDER — LANTUS SOLOSTAR 100 UNIT/ML ~~LOC~~ SOPN: 26.0000 [IU] | PEN_INJECTOR | Freq: Every day | SUBCUTANEOUS | Status: DC

## 2015-04-13 MED ORDER — SITAGLIPTIN PHOSPHATE 50 MG PO TABS: 50.0000 mg | ORAL_TABLET | Freq: Every day | ORAL | Status: DC

## 2015-04-13 MED ORDER — ZOLPIDEM TARTRATE 5 MG PO TABS: 5.0000 mg | ORAL_TABLET | Freq: Every evening | ORAL | Status: DC | PRN

## 2015-04-13 MED ORDER — ESOMEPRAZOLE MAGNESIUM 40 MG PO CPDR: DELAYED_RELEASE_CAPSULE | ORAL | Status: DC
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
## 2015-04-13 NOTE — Assessment & Plan Note (Signed)
Assessment / Plan / Recommendations: After speaking with [name], it seems that she is experiencing significant distress and guilt about saying no to requests by family members for help of various sorts despite the fact that she already feels overwhelmed by the responsibilities she has already taken on in order to help others. [name] will benefit from assertiveness training. She will return next Tuesday 3/28 for a follow up IC appointment.
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
## 2015-04-13 NOTE — Assessment & Plan Note (Signed)
A1C worsened a little but still less than 7. Compliance with meds encouraged. Continue current regimen. Recheck A1C in 3 months.

## 2015-04-13 NOTE — Patient Instructions (Signed)
Major Depressive Disorder Major depressive disorder is a mental illness. It also may be called clinical depression or unipolar depression. Major depressive disorder usually causes feelings of sadness, hopelessness, or helplessness. Some people with this disorder do not feel particularly sad but lose interest in doing things they used to enjoy (anhedonia). Major depressive disorder also can cause physical symptoms. It can interfere with work, school, relationships, and other normal everyday activities. The disorder varies in severity but is longer lasting and more serious than the sadness we all feel from time to time in our lives. Major depressive disorder often is triggered by stressful life events or major life changes. Examples of these triggers include divorce, loss of your job or home, a move, and the death of a family member or close friend. Sometimes this disorder occurs for no obvious reason at all. People who have family members with major depressive disorder or bipolar disorder are at higher risk for developing this disorder, with or without life stressors. Major depressive disorder can occur at any age. It may occur just once in your life (single episode major depressive disorder). It may occur multiple times (recurrent major depressive disorder). SYMPTOMS People with major depressive disorder have either anhedonia or depressed mood on nearly a daily basis for at least 2 weeks or longer. Symptoms of depressed mood include:  Feelings of sadness (blue or down in the dumps) or emptiness.  Feelings of hopelessness or helplessness.  Tearfulness or episodes of crying (may be observed by others).  Irritability (children and adolescents). In addition to depressed mood or anhedonia or both, people with this disorder have at least four of the following symptoms:  Difficulty sleeping or sleeping too much.   Significant change (increase or decrease) in appetite or weight.   Lack of energy or  motivation.  Feelings of guilt and worthlessness.   Difficulty concentrating, remembering, or making decisions.  Unusually slow movement (psychomotor retardation) or restlessness (as observed by others).   Recurrent wishes for death, recurrent thoughts of self-harm (suicide), or a suicide attempt. People with major depressive disorder commonly have persistent negative thoughts about themselves, other people, and the world. People with severe major depressive disorder may experiencedistorted beliefs or perceptions about the world (psychotic delusions). They also may see or hear things that are not real (psychotic hallucinations). DIAGNOSIS Major depressive disorder is diagnosed through an assessment by your health care provider. Your health care provider will ask aboutaspects of your daily life, such as mood,sleep, and appetite, to see if you have the diagnostic symptoms of major depressive disorder. Your health care provider may ask about your medical history and use of alcohol or drugs, including prescription medicines. Your health care provider also may do a physical exam and blood work. This is because certain medical conditions and the use of certain substances can cause major depressive disorder-like symptoms (secondary depression). Your health care provider also may refer you to a mental health specialist for further evaluation and treatment. TREATMENT It is important to recognize the symptoms of major depressive disorder and seek treatment. The following treatments can be prescribed for this disorder:   Medicine. Antidepressant medicines usually are prescribed. Antidepressant medicines are thought to correct chemical imbalances in the brain that are commonly associated with major depressive disorder. Other types of medicine may be added if the symptoms do not respond to antidepressant medicines alone or if psychotic delusions or hallucinations occur.  Talk therapy. Talk therapy can be  helpful in treating major depressive disorder by providing   support, education, and guidance. Certain types of talk therapy also can help with negative thinking (cognitive behavioral therapy) and with relationship issues that trigger this disorder (interpersonal therapy). A mental health specialist can help determine which treatment is best for you. Most people with major depressive disorder do well with a combination of medicine and talk therapy. Treatments involving electrical stimulation of the brain can be used in situations with extremely severe symptoms or when medicine and talk therapy do not work over time. These treatments include electroconvulsive therapy, transcranial magnetic stimulation, and vagal nerve stimulation.   This information is not intended to replace advice given to you by your health care provider. Make sure you discuss any questions you have with your health care provider.   Document Released: 05/06/2012 Document Revised: 01/30/2014 Document Reviewed: 05/06/2012 Elsevier Interactive Patient Education 2016 [employer] Inc.  
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
## 2015-04-13 NOTE — Assessment & Plan Note (Signed)
BP initially elevated but improved after recheck. No change in meds for now. Continue monitoring on current regimen.

## 2015-05-11 ENCOUNTER — Ambulatory Visit: Payer: Medicare Other | Admitting: Family Medicine

## 2015-05-11 ENCOUNTER — Other Ambulatory Visit (HOSPITAL_COMMUNITY): Payer: Self-pay | Admitting: Family Medicine

## 2015-06-08 ENCOUNTER — Ambulatory Visit: Payer: Medicare Other | Admitting: Family Medicine

## 2015-06-15 ENCOUNTER — Ambulatory Visit: Payer: Medicare Other | Admitting: Family Medicine

## 2015-06-16 ENCOUNTER — Other Ambulatory Visit: Payer: Self-pay | Admitting: Family Medicine

## 2015-06-29 ENCOUNTER — Ambulatory Visit (INDEPENDENT_AMBULATORY_CARE_PROVIDER_SITE_OTHER): Payer: Medicare Other | Admitting: Family Medicine

## 2015-06-29 ENCOUNTER — Encounter: Payer: Self-pay | Admitting: Family Medicine

## 2015-06-29 VITALS — BP 141/79 | HR 92 | Temp 97.5°F | Ht 67.5 in | Wt 320.2 lb

## 2015-06-29 DIAGNOSIS — E119 Type 2 diabetes mellitus without complications: Secondary | ICD-10-CM | POA: Diagnosis not present

## 2015-06-29 DIAGNOSIS — E669 Obesity, unspecified: Secondary | ICD-10-CM

## 2015-06-29 DIAGNOSIS — Z0000 Encounter for general adult medical examination without abnormal findings: Secondary | ICD-10-CM | POA: Diagnosis not present

## 2015-06-29 DIAGNOSIS — M25562 Pain in left knee: Secondary | ICD-10-CM | POA: Diagnosis not present

## 2015-06-29 DIAGNOSIS — I10 Essential (primary) hypertension: Secondary | ICD-10-CM

## 2015-06-29 DIAGNOSIS — E1169 Type 2 diabetes mellitus with other specified complication: Secondary | ICD-10-CM

## 2015-06-29 LAB — POCT GLYCOSYLATED HEMOGLOBIN (HGB A1C): Hemoglobin A1C: 6.9

## 2015-06-29 MED ORDER — TETANUS-DIPHTH-ACELL PERTUSSIS 5-2.5-18.5 LF-MCG/0.5 IM SUSP: 0.5000 mL | Freq: Once | INTRAMUSCULAR | Status: DC

## 2015-06-29 MED ORDER — METFORMIN HCL 1000 MG PO TABS: 1000.0000 mg | ORAL_TABLET | Freq: Two times a day (BID) | ORAL | Status: DC

## 2015-06-29 NOTE — Assessment & Plan Note (Signed)
Mammogram recommended and instruction on how to schedule appointment discussed. She agreed to schedule appointment. She is due for Tdap. I gave script to take to the [facility] for immunization. She agreed with plan.

## 2015-06-29 NOTE — Patient Instructions (Addendum)
It was nice seeing you today. Your A1C went up just a little but still ok. However since you have never tried Metformin, I will recommend we start you on it and see if we can eventually get you off insulin. Please check your CBG at home regularly. If glucose is less than 70, please hold your insulin and call me. Also remember to schedule your mammogram as soon as possible. Please get your tetanus shot at the health department.

## 2015-06-29 NOTE — Assessment & Plan Note (Signed)
BP optimal. Continue current regimen.

## 2015-06-29 NOTE — Assessment & Plan Note (Signed)
A1C of 6.9 today which is yet another slight increase. Patient is interested in starting metformin. I reviewed her record and no contraindication to starting metformin. Plan is to gradually taper off insulin if she does well on Metformin. She was started n metformin 1000 mg BID. Continue current dose of lantus. Patient advised to keep log of her home CBG and see me back in a week to adjust insulin. Hold insulin if CBG is less than 70. Continue Januvia. She agreed with plan and verbalized understanding. She is looking forward to getting off insulin if possible.
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
## 2015-07-08 ENCOUNTER — Ambulatory Visit: Payer: Medicare Other | Admitting: Physical Therapy

## 2015-07-09 ENCOUNTER — Other Ambulatory Visit: Payer: Self-pay | Admitting: Family Medicine

## 2015-07-09 DIAGNOSIS — Z1231 Encounter for screening mammogram for malignant neoplasm of breast: Secondary | ICD-10-CM

## 2015-07-13 ENCOUNTER — Other Ambulatory Visit: Payer: Self-pay | Admitting: Family Medicine

## 2015-07-13 ENCOUNTER — Ambulatory Visit: Payer: Medicare Other | Admitting: Family Medicine

## 2015-07-13 MED ORDER — TRAMADOL HCL 50 MG PO TABS: ORAL_TABLET | ORAL | Status: DC

## 2015-07-13 NOTE — Telephone Encounter (Signed)
Spoke with patient and she is aware that script has been faxed to the pharmacy. Kristina Hendricks,CMA

## 2015-07-13 NOTE — Telephone Encounter (Signed)
Needs refill on tramadol.  [facility] at [address]

## 2015-07-26 ENCOUNTER — Other Ambulatory Visit: Payer: Self-pay | Admitting: Family Medicine

## 2015-08-12 ENCOUNTER — Ambulatory Visit: Payer: Medicare Other

## 2015-08-13 ENCOUNTER — Ambulatory Visit: Payer: Medicare Other | Admitting: Family Medicine

## 2015-08-19 ENCOUNTER — Other Ambulatory Visit: Payer: Self-pay | Admitting: Family Medicine

## 2015-08-30 ENCOUNTER — Other Ambulatory Visit: Payer: Self-pay | Admitting: Family Medicine

## 2015-09-01 ENCOUNTER — Other Ambulatory Visit: Payer: Self-pay | Admitting: Family Medicine

## 2015-09-01 NOTE — Telephone Encounter (Signed)
Spoke with patient and informed her that script still had 1 refill on it from the script she printed for her in June.  Confirmed this with pharmacy and they will fill it today for patient.  She is aware of this. Brynli Ollis,CMA

## 2015-09-10 ENCOUNTER — Encounter: Payer: Self-pay | Admitting: Family Medicine

## 2015-09-10 ENCOUNTER — Ambulatory Visit (INDEPENDENT_AMBULATORY_CARE_PROVIDER_SITE_OTHER): Payer: Medicare Other | Admitting: Family Medicine

## 2015-09-10 VITALS — BP 132/80 | HR 98 | Ht 68.0 in | Wt 313.0 lb

## 2015-09-10 DIAGNOSIS — G894 Chronic pain syndrome: Secondary | ICD-10-CM | POA: Diagnosis not present

## 2015-09-10 DIAGNOSIS — K439 Ventral hernia without obstruction or gangrene: Secondary | ICD-10-CM | POA: Diagnosis not present

## 2015-09-10 DIAGNOSIS — E119 Type 2 diabetes mellitus without complications: Secondary | ICD-10-CM | POA: Diagnosis not present

## 2015-09-10 DIAGNOSIS — M1990 Unspecified osteoarthritis, unspecified site: Secondary | ICD-10-CM

## 2015-09-10 DIAGNOSIS — I10 Essential (primary) hypertension: Secondary | ICD-10-CM

## 2015-09-10 DIAGNOSIS — E669 Obesity, unspecified: Secondary | ICD-10-CM

## 2015-09-10 DIAGNOSIS — E785 Hyperlipidemia, unspecified: Secondary | ICD-10-CM

## 2015-09-10 DIAGNOSIS — E1169 Type 2 diabetes mellitus with other specified complication: Secondary | ICD-10-CM

## 2015-09-10 MED ORDER — METFORMIN HCL 500 MG PO TABS: 500.0000 mg | ORAL_TABLET | Freq: Two times a day (BID) | ORAL | 1 refills | Status: DC

## 2015-09-10 NOTE — Patient Instructions (Signed)
It was nice to see you. I am sorry the Metformin makes you have diarrhea. Please cut to 500mg  from 1000mg . I will check you in about 3-4 wks for your diabetes. I will also refer you back to the gastroenterologist for your hernia. Please schedule your mammogram. Call me if you have any question.

## 2015-09-10 NOTE — Assessment & Plan Note (Addendum)
BP ok. Continue current regimen. Bmet and FLP checked.

## 2015-09-10 NOTE — Assessment & Plan Note (Signed)
Stable on tramadol. Urine toxicology test done today as part of her control meds contract.

## 2015-09-10 NOTE — Assessment & Plan Note (Addendum)
Now having symptoms. Patient referred back to surgery for further management. Return precaution discussed.

## 2015-09-10 NOTE — Progress Notes (Signed)
Subjective:     Patient ID: Kristina Hendricks, female   DOB: October 24, 1960, 55 y.o.   MRN: [id number]  HPI  Abdominal pain:C/O worsening periumbilical pain around the area of her hernia. This is intermittent. Denies any other GI symptoms. HTN/HLD/DM2:She is compliant with all her meds. She stated her Metformin makes her have diarrhea. Denies any other concern. She is here for follow up. Arthritis: Here for follow up. No concern.  Current Outpatient Prescriptions on File Prior to Visit  Medication Sig Dispense Refill  . ADVAIR DISKUS 250-50 MCG/DOSE AEPB Inhale 1 puff into the lungs 2 (two) times daily. 60 each 3  . ARIPiprazole (ABILIFY) 30 MG tablet TAKE 1 TABLET(30 MG) BY MOUTH DAILY 90 tablet 1  . aspirin EC 81 MG tablet Take 81 mg by mouth daily.    Marland Kitchen diltiazem (TIAZAC) 240 MG 24 hr capsule TAKE 1 CAPSULE BY MOUTH EVERY DAY 90 capsule 1  . JANUVIA 50 MG tablet TAKE 1 TABLET(50 MG) BY MOUTH DAILY 90 tablet 1  . LANTUS SOLOSTAR 100 UNIT/ML Solostar Pen Inject 26 Units into the skin at bedtime. 15 mL 3  . metFORMIN (GLUCOPHAGE) 1000 MG tablet Take 1 tablet (1,000 mg total) by mouth 2 (two) times daily with a meal. 180 tablet 3  . mometasone (NASONEX) 50 MCG/ACT nasal spray Place 2 sprays into the nose daily. (Patient taking differently: Place 2 sprays into the nose 2 (two) times daily as needed (Congestion/stuffiness). ) 17 g 5  . pravastatin (PRAVACHOL) 20 MG tablet Take 1 tablet (20 mg total) by mouth daily. 90 tablet 1  . telmisartan-hydrochlorothiazide (MICARDIS HCT) 80-25 MG tablet TAKE 1 TABLET BY MOUTH DAILY 90 tablet 1  . traMADol (ULTRAM) 50 MG tablet 1-2 tab every 8 hours as needed for pain 120 tablet 2  . albuterol (PROVENTIL HFA;VENTOLIN HFA) 108 (90 BASE) MCG/ACT inhaler Inhale 2 puffs into the lungs every 6 (six) hours as needed for wheezing or shortness of breath. (Patient not taking: Reported on 09/10/2015) 18 g 3  . B-D ULTRAFINE III SHORT PEN 31G X 8 MM MISC Inject 1 applicator  into the skin daily as needed. Use daily to inject insulin as instructed 100 each 4  . ibuprofen (ADVIL,MOTRIN) 800 MG tablet Take 800 mg by mouth every 8 (eight) hours as needed for headache or moderate pain. Reported on 06/29/2015    . ONE TOUCH ULTRA TEST test strip USE TO CHECK BLOOD SUGAR THREE TIMES DAILY OR AS DIRECTED. 100 each 4  . Tdap (BOOSTRIX) 5-2.5-18.5 LF-MCG/0.5 injection Inject 0.5 mLs into the muscle once. 0.5 mL 0   No current facility-administered medications on file prior to visit.    Past Medical History:  Diagnosis Date  . Arthritis   . Asthma   . COPD (chronic obstructive pulmonary disease) ([place])   . Diabetes mellitus without complication ([place])   . Hypertension   . Sleep apnea       Review of Systems  Constitutional: Negative.   Respiratory: Negative.   Cardiovascular: Negative.   Gastrointestinal: Positive for abdominal pain. Negative for constipation, diarrhea, nausea and vomiting.  Musculoskeletal: Negative.   All other systems reviewed and are negative.  Vitals:   09/10/15 1105  BP: 132/80  Pulse: 98  SpO2: 98%  Weight: (!) 313 lb (142 kg)  Height: 5\' 8"  (1.727 m)       Objective:   Physical Exam  Constitutional: She is oriented to person, place, and time. She appears well-developed. No distress.  Cardiovascular: Normal rate, regular rhythm and normal heart sounds.   No murmur heard. Pulmonary/Chest: Effort normal and breath sounds normal. No respiratory distress. She has no wheezes. She exhibits no tenderness.  Abdominal: Soft. Bowel sounds are normal. There is no tenderness. There is no rebound and no guarding.  periumbilical hernia mild, non-tender, no erythema.  Musculoskeletal: She exhibits no edema.  Neurological: She is alert and oriented to person, place, and time. No cranial nerve deficit.       Assessment:     Abdominal pain: HTN/HLD: DM2: Arthritis    Plan:     Check problem list.

## 2015-09-10 NOTE — Assessment & Plan Note (Signed)
Compliant with meds. May cut back on Metformin from 1000 mg BID to 500 BID. She verbalized understanding.

## 2015-09-11 LAB — BASIC METABOLIC PANEL WITH GFR
BUN: 7 mg/dL (ref 7–25)
CALCIUM: 9.5 mg/dL (ref 8.6–10.4)
CO2: 24 mmol/L (ref 20–31)
CREATININE: 0.64 mg/dL (ref 0.50–1.05)
Chloride: 102 mmol/L (ref 98–110)
GFR, Est Non African American: >89 mL/min (ref >=60)
Glucose, Bld: 88 mg/dL (ref 65–99)
Potassium: 3.9 mmol/L (ref 3.5–5.3)
Sodium: 136 mmol/L (ref 135–146)

## 2015-09-11 LAB — LIPID PANEL
CHOLESTEROL: 126 mg/dL (ref 125–200)
HDL: 38 mg/dL — ABNORMAL LOW (ref >=46)
LDL Cholesterol: 70 mg/dL (ref <130)
Total CHOL/HDL Ratio: 3.3 Ratio (ref <=5.0)
Triglycerides: 92 mg/dL (ref <150)
VLDL: 18 mg/dL (ref <30)

## 2015-09-13 NOTE — Addendum Note (Signed)
Addended by: Andrena Mews T on: 09/13/2015 09:03 AM   Modules accepted: Orders

## 2015-09-15 ENCOUNTER — Telehealth: Payer: Self-pay | Admitting: Family Medicine

## 2015-09-15 DIAGNOSIS — M19171 Post-traumatic osteoarthritis, right ankle and foot: Secondary | ICD-10-CM

## 2015-09-15 DIAGNOSIS — M25551 Pain in right hip: Secondary | ICD-10-CM

## 2015-09-15 DIAGNOSIS — M1712 Unilateral primary osteoarthritis, left knee: Secondary | ICD-10-CM

## 2015-09-15 LAB — PAIN MGMT, PROFILE 5 W/O MEDMATCH U
Amphetamines: NEGATIVE ng/mL (ref <500)
BENZODIAZEPINES: NEGATIVE ng/mL (ref <100)
Barbiturates: NEGATIVE ng/mL (ref <300)
Cocaine Metabolite: NEGATIVE ng/mL (ref <150)
Creatinine: 19.7 mg/dL — ABNORMAL LOW (ref >=20.0)
Marijuana Metabolite: 115 ng/mL — ABNORMAL HIGH (ref <5)
Marijuana Metabolite: POSITIVE ng/mL — AB (ref <20)
Methadone Metabolite: NEGATIVE ng/mL (ref <100)
OPIATES: NEGATIVE ng/mL (ref <100)
OXIDANT: NEGATIVE ug/mL (ref <200)
Oxycodone: NEGATIVE ng/mL (ref <100)
PH: 6.62 (ref 4.5–9.0)
SPECIFIC GRAVITY: 1.031 (ref >=1.003)

## 2015-09-15 NOTE — Telephone Encounter (Signed)
Her to discuss her urine toxicology report. + for Marijuana. Tramadol appropriately neg since the test ordered does not include tramadol check.  This is the second positive Marijuana, despite counseling. As discussed with her, I do not feel comfortable managing her pain given positive Marijuana. I recommended referral to pain clinic where she can receive adequate supervision. She agreed with plan.  We will continue to manage her other health condition. All questions were answered.

## 2015-09-21 ENCOUNTER — Ambulatory Visit: Payer: Medicare Other | Admitting: *Deleted

## 2015-11-04 ENCOUNTER — Other Ambulatory Visit: Payer: Self-pay | Admitting: Family Medicine

## 2015-11-26 ENCOUNTER — Encounter: Payer: Self-pay | Admitting: Family Medicine

## 2015-11-26 ENCOUNTER — Ambulatory Visit (INDEPENDENT_AMBULATORY_CARE_PROVIDER_SITE_OTHER): Payer: Medicare Other | Admitting: Family Medicine

## 2015-11-26 VITALS — BP 122/85 | HR 93 | Temp 98.1°F | Wt 318.0 lb

## 2015-11-26 DIAGNOSIS — F418 Other specified anxiety disorders: Secondary | ICD-10-CM

## 2015-11-26 DIAGNOSIS — E1169 Type 2 diabetes mellitus with other specified complication: Secondary | ICD-10-CM

## 2015-11-26 DIAGNOSIS — E669 Obesity, unspecified: Secondary | ICD-10-CM

## 2015-11-26 DIAGNOSIS — I10 Essential (primary) hypertension: Secondary | ICD-10-CM

## 2015-11-26 DIAGNOSIS — Z794 Long term (current) use of insulin: Secondary | ICD-10-CM

## 2015-11-26 DIAGNOSIS — E119 Type 2 diabetes mellitus without complications: Secondary | ICD-10-CM

## 2015-11-26 DIAGNOSIS — Z23 Encounter for immunization: Secondary | ICD-10-CM

## 2015-11-26 DIAGNOSIS — J449 Chronic obstructive pulmonary disease, unspecified: Secondary | ICD-10-CM

## 2015-11-26 DIAGNOSIS — F32A Depression, unspecified: Secondary | ICD-10-CM

## 2015-11-26 DIAGNOSIS — F419 Anxiety disorder, unspecified: Secondary | ICD-10-CM

## 2015-11-26 DIAGNOSIS — F329 Major depressive disorder, single episode, unspecified: Secondary | ICD-10-CM

## 2015-11-26 LAB — POCT GLYCOSYLATED HEMOGLOBIN (HGB A1C): HEMOGLOBIN A1C: 6.7

## 2015-11-26 MED ORDER — TETANUS-DIPHTH-ACELL PERTUSSIS 5-2.5-18.5 LF-MCG/0.5 IM SUSP: 0.5000 mL | Freq: Once | INTRAMUSCULAR | 0 refills | Status: AC

## 2015-11-26 MED ORDER — BUSPIRONE HCL 7.5 MG PO TABS: 7.5000 mg | ORAL_TABLET | Freq: Two times a day (BID) | ORAL | 1 refills | Status: DC

## 2015-11-26 NOTE — Assessment & Plan Note (Signed)
No acute change. Continue to monitor on current regimen.

## 2015-11-26 NOTE — Assessment & Plan Note (Signed)
Optimal. Continue current regimen. 

## 2015-11-26 NOTE — Progress Notes (Signed)
   11/26/15 1042  PHQ-9 Depression Scale - How often have you been bothered by each of the following symptoms during the past two weeks?  Little interest or pleasure in doing things 2  Feeling down, depressed, or hopeless 1  Trouble falling or staying asleep, or sleeping too much 2  Feeling tired or having little energy 1  Poor appetite or overeating 2  Feeling bad about yourself - or that you are a failure or have let yourself or your family down 1  Trouble concentrating on things, such as reading the newspaper or watching television 1  Moving or speaking so slowly that other people could have noticed. Or the opposite - being so fidgety or restless that you have been moving around a lot more than usual 0  Thoughts that you would be better off dead, or of hurting yourself in some way 0  Score 10

## 2015-11-26 NOTE — Assessment & Plan Note (Signed)
A1C 6.7 today. Continue current DM regimen Januvia 50 mg qd, Metformin 500mg  BID and lantus 26 units qd.

## 2015-11-26 NOTE — Progress Notes (Addendum)
Subjective:     Patient ID: Kristina Hendricks, female   DOB: 08-11-1960, 55 y.o.   MRN: [id number]  HPI DM2/HTN: She is compliant with her meds. Here for follow up. COPD:Denies any cough, no SOB,she is compliant with her Advair and uses albuterol occasionally. Anxiety/Depression: lost grandma last Tuesday. Gyn cancer. She has her grand chilld at home with her and they help lift her spirit up.Denies excessive energy, feels mostly depressed and anxious. She will like to be on anxiety medication.  Review of Systems  Respiratory: Negative.   Cardiovascular: Negative.   Gastrointestinal: Negative.   Genitourinary: Negative.   Psychiatric/Behavioral: Positive for decreased concentration and sleep disturbance. Negative for behavioral problems, hallucinations, self-injury and suicidal ideas. The patient is nervous/anxious. The patient is not hyperactive.   All other systems reviewed and are negative.      Objective:   Physical Exam  Constitutional: She is oriented to person, place, and time. She appears well-developed. No distress.  Cardiovascular: Normal rate, regular rhythm and normal heart sounds.   No murmur heard. Pulmonary/Chest: Effort normal and breath sounds normal. No respiratory distress. She has no wheezes. She exhibits no tenderness.  Abdominal: Soft. Bowel sounds are normal. She exhibits no distension and no mass. There is no tenderness.  Musculoskeletal: Normal range of motion.  Neurological: She is alert and oriented to person, place, and time.  Psychiatric: She has a normal mood and affect. Her speech is normal and behavior is normal. Judgment and thought content normal. Cognition and memory are normal. She expresses no homicidal and no suicidal ideation. She expresses no suicidal plans and no homicidal plans.  Nursing note and vitals reviewed.      Assessment:     DM2 HTN:  COPD: Anxiety with depression    Plan:     Check problem list  HM: FLu shot given  today. Mammogram slip given again and she was encouraged to schedule mammogram appointment. She verbalized understanding. She lost her Tdap prescription, another one printed for her to get it at the pharmacy.

## 2015-11-26 NOTE — Assessment & Plan Note (Signed)
Likely related to acute stress reaction following loss of her grandmother. Her own mother passed many years ago. GAD7 score of 11 and PHQ9 score of 10. No symptoms suggestive of hyperactivity. Continue Abilify for Bipolar/Depression. Buspar added for anxiety. I recommended counseling today with [facility] but she declined. She stated her grand kids are with her and they are keeping her spirit up. Return precaution discussed.

## 2015-11-26 NOTE — Patient Instructions (Signed)
Buspirone tablets What is this medicine? BUSPIRONE (byoo SPYE rone) is used to treat anxiety disorders. This medicine may be used for other purposes; ask your health care provider or pharmacist if you have questions. What should I tell my health care provider before I take this medicine? They need to know if you have any of these conditions: -kidney or liver disease -an unusual or allergic reaction to buspirone, other medicines, foods, dyes, or preservatives -pregnant or trying to get pregnant -breast-feeding How should I use this medicine? Take this medicine by mouth with a glass of water. Follow the directions on the prescription label. You may take this medicine with or without food. To ensure that this medicine always works the same way for you, you should take it either always with or always without food. Take your doses at regular intervals. Do not take your medicine more often than directed. Do not stop taking except on the advice of your doctor or health care professional. Talk to your pediatrician regarding the use of this medicine in children. Special care may be needed. Overdosage: If you think you have taken too much of this medicine contact a poison control center or emergency room at once. NOTE: This medicine is only for you. Do not share this medicine with others. What if I miss a dose? If you miss a dose, take it as soon as you can. If it is almost time for your next dose, take only that dose. Do not take double or extra doses. What may interact with this medicine? Do not take this medicine with any of the following medications: -linezolid -MAOIs like Carbex, Eldepryl, Marplan, Nardil, and Parnate -methylene blue -procarbazine This medicine may also interact with the following medications: -diazepam -digoxin -diltiazem -erythromycin -grapefruit juice -haloperidol -medicines for mental depression or mood problems -medicines for seizures like carbamazepine, phenobarbital  and phenytoin -nefazodone -other medications for anxiety -rifampin -ritonavir -some antifungal medicines like itraconazole, ketoconazole, and voriconazole -verapamil -warfarin This list may not describe all possible interactions. Give your health care provider a list of all the medicines, herbs, non-prescription drugs, or dietary supplements you use. Also tell them if you smoke, drink alcohol, or use illegal drugs. Some items may interact with your medicine. What should I watch for while using this medicine? Visit your doctor or health care professional for regular checks on your progress. It may take 1 to 2 weeks before your anxiety gets better. You may get drowsy or dizzy. Do not drive, use machinery, or do anything that needs mental alertness until you know how this drug affects you. Do not stand or sit up quickly, especially if you are an older patient. This reduces the risk of dizzy or fainting spells. Alcohol can make you more drowsy and dizzy. Avoid alcoholic drinks. What side effects may I notice from receiving this medicine? Side effects that you should report to your doctor or health care professional as soon as possible: -blurred vision or other vision changes -chest pain -confusion -difficulty breathing -feelings of hostility or anger -muscle aches and pains -numbness or tingling in hands or feet -ringing in the ears -skin rash and itching -vomiting -weakness Side effects that usually do not require medical attention (report to your doctor or health care professional if they continue or are bothersome): -disturbed dreams, nightmares -headache -nausea -restlessness or nervousness -sore throat and nasal congestion -stomach upset This list may not describe all possible side effects. Call your doctor for medical advice about side effects. You may report   side effects to FDA at 1-800-FDA-1088. Where should I keep my medicine? Keep out of the reach of children. Store at room  temperature below 30 degrees C (86 degrees F). Protect from light. Keep container tightly closed. Throw away any unused medicine after the expiration date. NOTE: This sheet is a summary. It may not cover all possible information. If you have questions about this medicine, talk to your doctor, pharmacist, or health care provider.    2016, Elsevier/Gold Standard. (2009-08-19 18:06:11)  

## 2015-12-03 LAB — HM DIABETES EYE EXAM

## 2015-12-08 ENCOUNTER — Telehealth: Payer: Self-pay | Admitting: Family Medicine

## 2015-12-08 ENCOUNTER — Other Ambulatory Visit: Payer: Self-pay | Admitting: Family Medicine

## 2015-12-08 MED ORDER — BUSPIRONE HCL 10 MG PO TABS: 10.0000 mg | ORAL_TABLET | Freq: Two times a day (BID) | ORAL | 1 refills | Status: DC

## 2015-12-08 NOTE — Telephone Encounter (Signed)
Spoke with patient regarding medication increase.  Advised patient that Buspar increased to 10 mg BID.  Patient to follow with [facility].  Derl Barrow, RN

## 2015-12-08 NOTE — Telephone Encounter (Signed)
Please advise patient. I have increased the dose of her Buspar to 10 mg bid May pick up at the pharmacy. Also Let her know that I will not prescribe benzo given hx of abnormal urine drug test. Please advise her to schedule follow up with our behavioral specialist for counseling or contact [facility] or [facility] for further management. She will benefit from seeing a psychiatrist given her hx of Bipiolar as well.  [place] Address: [address], [place], [place] [address] Hours: Open today  Open 24 hours Phone: [phone]  [facility] behavioral health Address: [place], [place], [place] [address] Hours: Open today  8AM-5PM Phone: [phone]  [facility] Address: [address], [place], [place] [address] Hours: Open today  8AM-5PM Phone: [phone]

## 2015-12-08 NOTE — Telephone Encounter (Signed)
Patient made aware by clinic rn. Thijs Brunton,CMA

## 2015-12-08 NOTE — Telephone Encounter (Signed)
Pt states the medication she was given for anxiety is not working and would like to try something else. Pt uses [facility] on [address]. Please advise. Thanks! ep

## 2015-12-08 NOTE — Telephone Encounter (Signed)
Will route to PCP.  Patient may be asked to schedule an appointment.  Derl Barrow, RN

## 2015-12-08 NOTE — Telephone Encounter (Signed)
Please advise patient. I have increased the dose of her Buspar to 15 mg qd. May pick up at the pharmacy. Also Let her know that I will not prescribe benzo given hx of abnormal urine drug test. Please advise her to schedule follow up with our behavioral specialist for counseling or contact [facility] or [facility] for further management. She will benefit from seeing a psychiatrist given her hx of Bipiolar as well.  [place] Address: [address] [place], [place] [address] Hours: Open today  Open 24 hours Phone: [phone]  [facility] behavioral health Address: [place], [place], [place] [address] Hours: Open today  8AM-5PM Phone: [phone]  [facility] Address: [address], [place],  [address] Hours: Open today  8AM-5PM Phone: [phone]

## 2015-12-10 ENCOUNTER — Other Ambulatory Visit: Payer: Self-pay | Admitting: Family Medicine

## 2015-12-10 NOTE — Telephone Encounter (Signed)
Pt needs refills on lantus and ambilify.  [facility] on [address]

## 2015-12-31 ENCOUNTER — Ambulatory Visit (INDEPENDENT_AMBULATORY_CARE_PROVIDER_SITE_OTHER): Payer: Medicare Other | Admitting: Family Medicine

## 2015-12-31 ENCOUNTER — Encounter: Payer: Self-pay | Admitting: Family Medicine

## 2015-12-31 DIAGNOSIS — F3181 Bipolar II disorder: Secondary | ICD-10-CM

## 2015-12-31 DIAGNOSIS — F411 Generalized anxiety disorder: Secondary | ICD-10-CM | POA: Diagnosis not present

## 2015-12-31 DIAGNOSIS — E669 Obesity, unspecified: Secondary | ICD-10-CM | POA: Diagnosis not present

## 2015-12-31 DIAGNOSIS — E1169 Type 2 diabetes mellitus with other specified complication: Secondary | ICD-10-CM

## 2015-12-31 MED ORDER — HYDROXYZINE PAMOATE 50 MG PO CAPS: 50.0000 mg | ORAL_CAPSULE | Freq: Two times a day (BID) | ORAL | 0 refills | Status: DC | PRN

## 2015-12-31 NOTE — Assessment & Plan Note (Addendum)
Poor adherence to medication. I encouraged her to try and get back on her recommended dose of Metformin in addition to her insulin. She is due for A1C check in 1-2 months. She mentioned she had one done recently which is around 7. We will try to obtain that result otherwise we will repeat test. I will adjust her meds once I get the result. She has follow up with me in 4 wks.

## 2015-12-31 NOTE — Assessment & Plan Note (Signed)
Here for following. Claimed compliance with her abilify. She is on maximum dose of abilify. Will not change medication at this time. She is advised to call psych office for appointment. List of psychiatrist in town given to her. She agreed with plan. Return precaution discussed.

## 2015-12-31 NOTE — Patient Instructions (Signed)
It was nice seeing you. I have added hydroxyzine to your medication. At this point you will benefit from seeing a psychiatrist   Hallucination Introduction Psychosis refers to a severe loss of contact with reality. During a psychotic episode, a person is not able to think clearly, and his or her emotions and responses do not match up with what is actually happening. Someone may have false beliefs about what is happening or who they are (delusions). Someone may see, hear, taste, smell, or feel things that are not present (hallucinations). Psychosis usually occurs with very serious mental health (psychiatric) conditions such as schizophrenia, bipolar disorder, or major depression. It can sometimes also be the result of drug use or certain medical conditions. What are the signs or symptoms? Symptoms of a psychotic episode include:  Delusions, such as:  Feeling excessive fear or suspicion (paranoia).  Believing something that is odd, unrealistic, or false, such as having a false belief about being someone else.  Hallucinations.  Disorganized thinking, such as thoughts that jump from one to another that do not make sense to others.  Disorganized speech, such as saying things that do not make sense to others.  Inappropriate behavior, such as talking to oneself or intruding on unfamiliar people. How is this diagnosed? A diagnosis of psychosis is made through an assessment by a health care provider, who will ask questions about thoughts, feelings, behavior, drug use, and medical conditions. The health care provider may also do one or more of the following:  Physical exam.  Blood tests.  Brain imaging, such as a CT scan or MRI.  Brain wave study (EEG). The health care provider may make a referral for further evaluation by a mental health professional. How is this treated? Treatment depends on the cause of the psychosis. Treatment may include one or more of the following:  Monitoring and  supportive care in the emergency room or hospital.  Taking medicines (antipsychotic medicine) to reduce symptoms and to balance chemicals in the brain.  Treating an underlying medical condition.  Stopping or reducing drugs that are causing psychosis.  Therapy and other supportive programs outside of the hospital. Follow these instructions at home:  Over-the-counter and prescription medicines should be taken only as told by the health care provider.  The health care provider should be consulted before over-the-counter medicines, herbs, or supplements are used.  All follow-up visits should be kept as told by the health care provider. This is important.  A healthy lifestyle should be maintained. This includes:  Eating a healthy diet.  Getting enough sleep.  Exercising regularly.  Avoiding alcohol and recreational drugs as told by the health care provider. Contact a health care provider if:  Medicines do not seem to be helping.  The person hears voices telling him or her to do things.  The person continues to see, smell, or feel things that are not there.  The person feels extremely fearful and suspicious that someone or something will harm him or her.  The person feels unable to leave his or her house.  The person has trouble taking care of himself or herself.  The person experiences side effects of medicines, such as:  Changes in sleep patterns.  Dizziness.  Weight gain.  Restlessness.  Movement changes.  Muscle spasms.  Tremors. Get help right away if:  Serious thoughts occur about self-harm or about hurting others.  There are serious side effects of medicine, such as:  Swelling of the face, lips, tongue, or throat.  Fever, confusion, muscle spasms, or seizures. This information is not intended to replace advice given to you by your health care provider. Make sure you discuss any questions you have with your health care provider. Document Released:  06/29/2009 Document Revised: 06/17/2015 Document Reviewed: 01/13/2014  2017 Elsevier . Please call for appointment from the list given. Go to the ED if having any feeling of hurting yourself.

## 2015-12-31 NOTE — Progress Notes (Signed)
Subjective:     Patient ID: Kristina Hendricks, female   DOB: 03/19/1960, 55 y.o.   MRN: [id number]  HPI  Anxiety:Here for follow up. No improvement on Burpar so she stopped it altogether. unable to sleep at night. Symptoms on going for 1 or 2 months. Triggered after the passing of her grandmother on Oct 31st. Denies suicidal ideation. She noticed that she sees insect crawling in her room when they are not actually there. She is compliant with her Abilify. DM: She stated she had a nurse visit at home and her A1C was checked which shows a value of 7. She has been taking her Metformin 1.5 tablet daily instead of two tablet daily. She stated at times she has some GI irritation on it.  Current Outpatient Prescriptions on File Prior to Visit  Medication Sig Dispense Refill  . ADVAIR DISKUS 250-50 MCG/DOSE AEPB Inhale 1 puff into the lungs 2 (two) times daily. 60 each 3  . albuterol (PROVENTIL HFA;VENTOLIN HFA) 108 (90 BASE) MCG/ACT inhaler Inhale 2 puffs into the lungs every 6 (six) hours as needed for wheezing or shortness of breath. (Patient not taking: Reported on 11/26/2015) 18 g 3  . ARIPiprazole (ABILIFY) 30 MG tablet TAKE 1 TABLET(30 MG) BY MOUTH DAILY 90 tablet 1  . aspirin EC 81 MG tablet Take 81 mg by mouth daily.    . B-D ULTRAFINE III SHORT PEN 31G X 8 MM MISC Inject 1 applicator into the skin daily as needed. Use daily to inject insulin as instructed 100 each 4  . busPIRone (BUSPAR) 10 MG tablet Take 1 tablet (10 mg total) by mouth 2 (two) times daily. 60 tablet 1  . diltiazem (TIAZAC) 240 MG 24 hr capsule TAKE 1 CAPSULE BY MOUTH EVERY DAY 90 capsule 1  . ibuprofen (ADVIL,MOTRIN) 800 MG tablet Take 800 mg by mouth every 8 (eight) hours as needed for headache or moderate pain. Reported on 06/29/2015    . JANUVIA 50 MG tablet TAKE 1 TABLET(50 MG) BY MOUTH DAILY 90 tablet 1  . LANTUS SOLOSTAR 100 UNIT/ML Solostar Pen ADMINISTER 26 UNITS UNDER THE SKIN AT BEDTIME 15 mL 3  . metFORMIN (GLUCOPHAGE)  500 MG tablet Take 1 tablet (500 mg total) by mouth 2 (two) times daily with a meal. 90 tablet 1  . mometasone (NASONEX) 50 MCG/ACT nasal spray Place 2 sprays into the nose daily. (Patient not taking: Reported on 11/26/2015) 17 g 5  . ONE TOUCH ULTRA TEST test strip USE TO CHECK BLOOD SUGAR THREE TIMES DAILY OR AS DIRECTED. 100 each 4  . pravastatin (PRAVACHOL) 20 MG tablet TAKE 1 TABLET(20 MG) BY MOUTH DAILY 90 tablet 1  . telmisartan-hydrochlorothiazide (MICARDIS HCT) 80-25 MG tablet TAKE 1 TABLET BY MOUTH DAILY 90 tablet 1  . traMADol (ULTRAM) 50 MG tablet 1-2 tab every 8 hours as needed for pain (Patient not taking: Reported on 11/26/2015) 120 tablet 2   No current facility-administered medications on file prior to visit.    Past Medical History:  Diagnosis Date  . Arthritis   . Asthma   . COPD (chronic obstructive pulmonary disease) ([place])   . Diabetes mellitus without complication ([place])   . Hypertension   . Sleep apnea     Review of Systems  Constitutional: Negative.   Respiratory: Negative.   Cardiovascular: Negative.   Genitourinary: Negative.   Neurological: Negative for seizures and headaches.  Psychiatric/Behavioral: Negative for agitation and suicidal ideas. The patient is nervous/anxious.  Hallucination  All other systems reviewed and are negative.      Objective:   Physical Exam  Constitutional: She is oriented to person, place, and time. She appears well-developed. No distress.  Cardiovascular: Normal rate, regular rhythm and normal heart sounds.   No murmur heard. Pulmonary/Chest: Effort normal and breath sounds normal. She has no wheezes.  Abdominal: Bowel sounds are normal. There is no tenderness.  Musculoskeletal: Normal range of motion.  Neurological: She is alert and oriented to person, place, and time.  Psychiatric: She has a normal mood and affect. Her speech is normal and behavior is normal. Judgment and thought content normal. Her mood appears not  anxious. Cognition and memory are normal. She expresses no homicidal and no suicidal ideation.  Nursing note and vitals reviewed.      Assessment:     Anxiety disorder DM2    Plan:     Check problem list

## 2015-12-31 NOTE — Assessment & Plan Note (Addendum)
Not compliant with [facility] follow up. GAD score of 17 She self D/C Burspar. As discussed with her, I will like to avoid starting her on Benzo. Hydroxyzine prn anxiety prescribed. She will benefit from SSRI which I din't want to add given hx of hallucination. As discussed with her, she will do well if she start seeing a psychiatrist. I again gave her list of psychiatrist for her to schedule appointment. Strict return precaution discussed. She verbalized understanding.

## 2016-01-10 ENCOUNTER — Encounter: Payer: Self-pay | Admitting: Family Medicine

## 2016-01-10 DIAGNOSIS — Z91199 Patient's noncompliance with other medical treatment and regimen due to unspecified reason: Secondary | ICD-10-CM | POA: Insufficient documentation

## 2016-01-10 DIAGNOSIS — Z5329 Procedure and treatment not carried out because of patient's decision for other reasons: Secondary | ICD-10-CM | POA: Insufficient documentation

## 2016-01-11 ENCOUNTER — Telehealth: Payer: Self-pay | Admitting: Family Medicine

## 2016-01-11 NOTE — Telephone Encounter (Signed)
I called the patient to discussed her "No Show" and appointment cancellation pattern. She has a "No Show" rate of 17%, and she has been canceling or not showing up for her appointment at least 2-3 times per month on an average. I discussed this pattern with her and the potential consequences which include dismissal from the practice. She stated she does not want to be dismissed. She has follow-up with me in Jan, she agreed with follow-up instruction.

## 2016-01-20 ENCOUNTER — Other Ambulatory Visit: Payer: Self-pay | Admitting: Family Medicine

## 2016-01-25 ENCOUNTER — Other Ambulatory Visit: Payer: Self-pay | Admitting: Family Medicine

## 2016-02-01 ENCOUNTER — Ambulatory Visit: Payer: Medicare Other | Admitting: Family Medicine

## 2016-02-04 ENCOUNTER — Ambulatory Visit (INDEPENDENT_AMBULATORY_CARE_PROVIDER_SITE_OTHER): Payer: Medicare Other | Admitting: Family Medicine

## 2016-02-04 ENCOUNTER — Encounter: Payer: Self-pay | Admitting: Family Medicine

## 2016-02-04 ENCOUNTER — Telehealth: Payer: Self-pay | Admitting: Psychology

## 2016-02-04 VITALS — BP 118/72 | HR 85 | Temp 98.7°F | Wt 322.0 lb

## 2016-02-04 DIAGNOSIS — R0602 Shortness of breath: Secondary | ICD-10-CM | POA: Diagnosis not present

## 2016-02-04 DIAGNOSIS — R0683 Snoring: Secondary | ICD-10-CM | POA: Diagnosis not present

## 2016-02-04 DIAGNOSIS — K219 Gastro-esophageal reflux disease without esophagitis: Secondary | ICD-10-CM

## 2016-02-04 DIAGNOSIS — G4700 Insomnia, unspecified: Secondary | ICD-10-CM | POA: Insufficient documentation

## 2016-02-04 DIAGNOSIS — E669 Obesity, unspecified: Secondary | ICD-10-CM

## 2016-02-04 DIAGNOSIS — E1169 Type 2 diabetes mellitus with other specified complication: Secondary | ICD-10-CM | POA: Diagnosis not present

## 2016-02-04 LAB — POCT GLYCOSYLATED HEMOGLOBIN (HGB A1C): Hemoglobin A1C: 6.7

## 2016-02-04 MED ORDER — ADVAIR DISKUS 250-50 MCG/DOSE IN AEPB: 1.0000 | INHALATION_SPRAY | Freq: Two times a day (BID) | RESPIRATORY_TRACT | 3 refills | Status: AC

## 2016-02-04 MED ORDER — MOMETASONE FUROATE 50 MCG/ACT NA SUSP: 2.0000 | Freq: Every day | NASAL | 5 refills | Status: AC

## 2016-02-04 MED ORDER — RANITIDINE HCL 150 MG PO CAPS: 150.0000 mg | ORAL_CAPSULE | Freq: Two times a day (BID) | ORAL | 3 refills | Status: AC

## 2016-02-04 MED ORDER — ALBUTEROL SULFATE HFA 108 (90 BASE) MCG/ACT IN AERS: 2.0000 | INHALATION_SPRAY | Freq: Four times a day (QID) | RESPIRATORY_TRACT | 3 refills | Status: AC | PRN

## 2016-02-04 NOTE — Assessment & Plan Note (Addendum)
Likely multifactorial ( Depression/ Bipolar, pain, GERD, SOB). As discussed with her, we will attempt to address underlying problem. Continue compliance with ability and f/u psych as instructed. Start Ranitidine for GERD. Assess for SOB and treat based on result. PHQ9 score of 20 today, as discussed with her, depression might be contributing to her sleep problem. She is on Abilify for Bipolar. Uncertain if this is effective treatment for her. She had not followed with psych despite multiple recommendations. She was advised to talk to our [facility] today, however she left before she could be seen by Dr. Gwenlyn Saran. Dr. Gwenlyn Saran will follow with her. F/L is no improvement.

## 2016-02-04 NOTE — Telephone Encounter (Signed)
Patient was seen in clinic today by her PCP, Dr. Gwendlyn Deutscher.  Dr. Gwendlyn Deutscher requested a [facility] to check in with patient but patient was confused and left.  I called her and she elected to schedule next Friday at 2:00 for [facility].

## 2016-02-04 NOTE — Assessment & Plan Note (Signed)
Per patient, her Nexium was discontinues when she started her Harvoni medication by ID. At this time, I will start her on Ranitidine instead which has less impact on kidney function. Avoid trigger food. Monitor for improvement.

## 2016-02-04 NOTE — Assessment & Plan Note (Signed)
Worse at night. Likely multifactorial ( COPD, OSA, Obesity,R/O CHF). ECHO ordered. Nursing to call for scheduling. Referral to sleep study ordered. Continue inhaler medications for COPD. I refilled albuterol and Advair. Return to me soon or go to the ED if symptoms worsens.

## 2016-02-04 NOTE — Progress Notes (Signed)
Subjective:     Patient ID: Kristina Hendricks, female   DOB: [birth year], 56 y.o.   MRN: [id number]  HPI DM2:She is compliant with medication. Here for follow up. Denies any concern. Sleep problem:She usually goes to bed at 8:30 pm and she does not fall asleep till 10 pm. She sleeps for 2 hours and then wakes up. She has heart burn all night which prevents her from sleeping; she feels if this improves her sleep pattern will also improve. Heart burn:  C/O worsening heart burn which started 3 weeks  ago, now worsening.Denies  blood in the stool. No N/V, no constipation. She was using Nexium but stopped few months ago. SOB: C/O SOB worse at night, she now sleep with 4 pillows, she endorse some wheezing, at times has pressure on her chest x 2 weeks. She mentioned people tell her she snores at night. She feels tired during the day. She has been using her albuterol more often than before.  Current Outpatient Prescriptions on File Prior to Visit  Medication Sig Dispense Refill  . ARIPiprazole (ABILIFY) 30 MG tablet TAKE 1 TABLET(30 MG) BY MOUTH DAILY 90 tablet 1  . aspirin EC 81 MG tablet Take 81 mg by mouth daily.    Marland Kitchen diltiazem (TIAZAC) 240 MG 24 hr capsule TAKE 1 CAPSULE BY MOUTH EVERY DAY 90 capsule 1  . ibuprofen (ADVIL,MOTRIN) 800 MG tablet Take 800 mg by mouth every 8 (eight) hours as needed for headache or moderate pain. Reported on 06/29/2015    . JANUVIA 50 MG tablet TAKE 1 TABLET(50 MG) BY MOUTH DAILY 90 tablet 2  . LANTUS SOLOSTAR 100 UNIT/ML Solostar Pen ADMINISTER 26 UNITS UNDER THE SKIN AT BEDTIME 15 mL 3  . metFORMIN (GLUCOPHAGE) 500 MG tablet Take 1 tablet (500 mg total) by mouth 2 (two) times daily with a meal. 90 tablet 1  . pravastatin (PRAVACHOL) 20 MG tablet TAKE 1 TABLET(20 MG) BY MOUTH DAILY 90 tablet 1  . telmisartan-hydrochlorothiazide (MICARDIS HCT) 80-25 MG tablet TAKE 1 TABLET BY MOUTH DAILY 90 tablet 2  . B-D ULTRAFINE III SHORT PEN 31G X 8 MM MISC USE EVERY DAY TO INJECT  INSULIN AS DIRECTED 100 each 2  . ONE TOUCH ULTRA TEST test strip USE TO CHECK BLOOD SUGAR THREE TIMES DAILY OR AS DIRECTED. 100 each 4   No current facility-administered medications on file prior to visit.    Past Medical History:  Diagnosis Date  . Arthritis   . Asthma   . COPD (chronic obstructive pulmonary disease) ([place])   . Diabetes mellitus without complication ([place])   . Hypertension   . Sleep apnea    Vitals:   02/04/16 0914  BP: 118/72  Pulse: 85  Temp: 98.7 F (37.1 C)  TempSrc: Oral  SpO2: 98%  Weight: (!) 322 lb (146.1 kg)     Review of Systems  Constitutional: Positive for fatigue. Negative for fever.  Respiratory: Positive for chest tightness, shortness of breath and wheezing.   Cardiovascular: Negative for palpitations and leg swelling.  Gastrointestinal: Positive for abdominal pain. Negative for blood in stool, constipation, diarrhea, nausea and vomiting.  Psychiatric/Behavioral: Positive for sleep disturbance. Negative for hallucinations and suicidal ideas.  All other systems reviewed and are negative.      Objective:   Physical Exam  Constitutional: She is oriented to person, place, and time. She appears well-developed. No distress.  Cardiovascular: Normal rate, regular rhythm and normal heart sounds.   No murmur heard. Pulmonary/Chest: Effort normal  and breath sounds normal. No respiratory distress. She has no wheezes.  Abdominal: Soft. Bowel sounds are normal. She exhibits no distension and no mass. There is no tenderness.  Musculoskeletal: Normal range of motion.  Trace edema B/L  Neurological: She is alert and oriented to person, place, and time.  Psychiatric: Her speech is normal. Judgment normal. Cognition and memory are normal. She expresses no homicidal and no suicidal ideation.  PHQ9=20  Nursing note and vitals reviewed.      Assessment:     DM2 Insomnia GERD Dyspnea    Plan:     Check problem list.

## 2016-02-04 NOTE — Patient Instructions (Signed)

## 2016-02-04 NOTE — Assessment & Plan Note (Signed)
Well controlled on current regimen. A1C today was 6.7 No dose adjustment needed.

## 2016-02-08 ENCOUNTER — Telehealth: Payer: Self-pay | Admitting: *Deleted

## 2016-02-08 NOTE — Telephone Encounter (Signed)
-----   Message from Kinnie Feil, MD sent at 02/04/2016  2:45 PM EST ----- Please help patient schedule ECHO.

## 2016-02-08 NOTE — Telephone Encounter (Signed)
Patient scheduled for her echo on Friday 02-18-16 at 11am at [facility].  Patient is aware of this. Jazmin Hartsell,CMA

## 2016-02-10 ENCOUNTER — Ambulatory Visit: Payer: Medicare Other

## 2016-02-11 ENCOUNTER — Ambulatory Visit: Payer: Medicare Other

## 2016-02-14 ENCOUNTER — Other Ambulatory Visit: Payer: Self-pay | Admitting: Family Medicine

## 2016-02-14 ENCOUNTER — Ambulatory Visit: Payer: Medicare Other

## 2016-02-14 ENCOUNTER — Ambulatory Visit
Admission: RE | Admit: 2016-02-14 | Discharge: 2016-02-14 | Disposition: A | Discharge status: Home / Self Care | Payer: Medicare Other | Source: Ambulatory Visit | Attending: Family Medicine | Admitting: Family Medicine

## 2016-02-14 DIAGNOSIS — Z1231 Encounter for screening mammogram for malignant neoplasm of breast: Secondary | ICD-10-CM

## 2016-02-18 ENCOUNTER — Ambulatory Visit (HOSPITAL_COMMUNITY): Payer: Medicare Other

## 2016-03-18 IMAGING — CT CT ABD-PELV W/ CM
2 of 5 series · 16 of 46 positions shown, 18 images · IV contrast (Omni 300)
Comparison: 10/27/2013

CLINICAL DATA: Shortness of breath, abdominal pain starting [REDACTED]

EXAM:
CT ABDOMEN AND PELVIS WITH CONTRAST
TECHNIQUE: Multidetector CT imaging of the abdomen and pelvis was performed
using the standard protocol following bolus administration of
intravenous contrast.
CONTRAST:  100mL OMNIPAQUE IOHEXOL 300 MG/ML  SOLN

[Series 2: abd/ pelvis 5.0 i30f 1 · axial · 0.97mm/px · z∈[-1123,-718]mm · 13 of 91 slices shown, 15 images]
[im 5/91  soft-tissue]
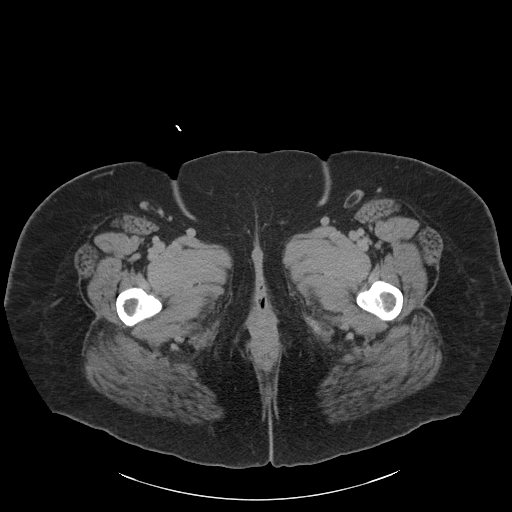
[im 5/91  bone]
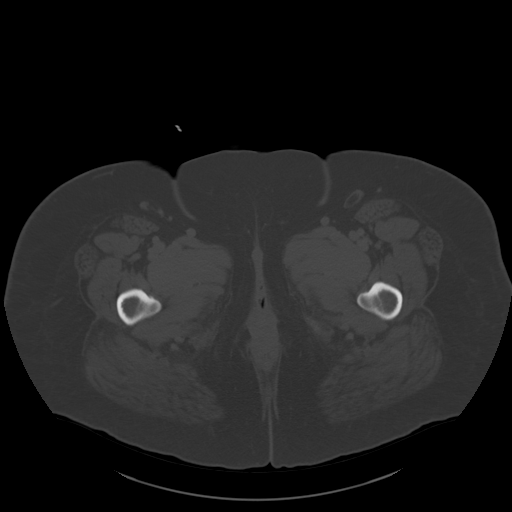
[im 13/91  soft-tissue]
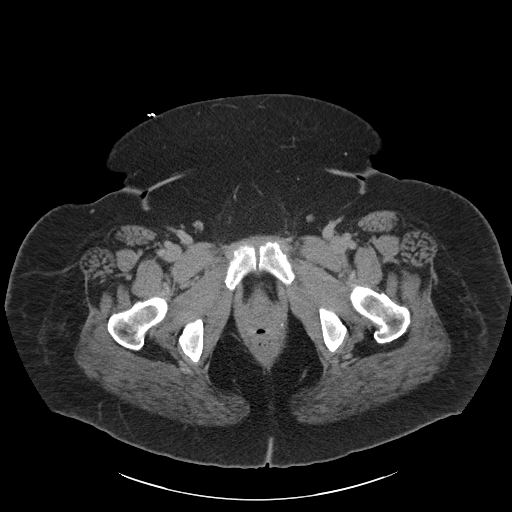
[im 18/91  soft-tissue]
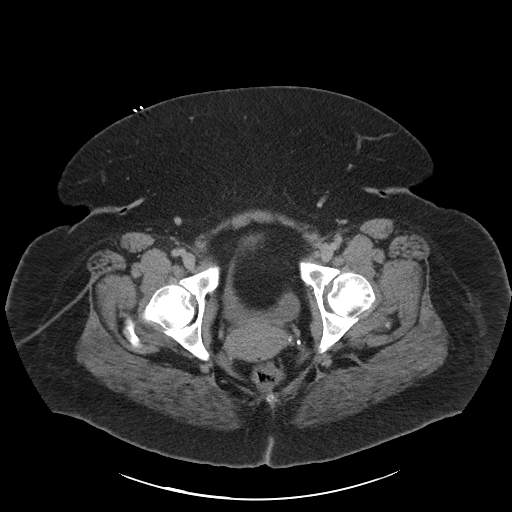
[im 26/91  soft-tissue]
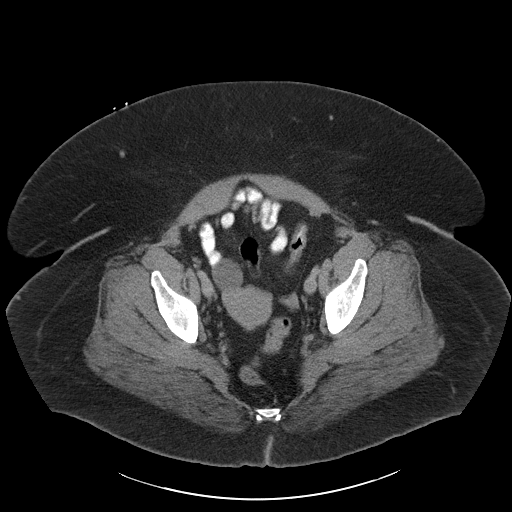
[im 31/91  soft-tissue]
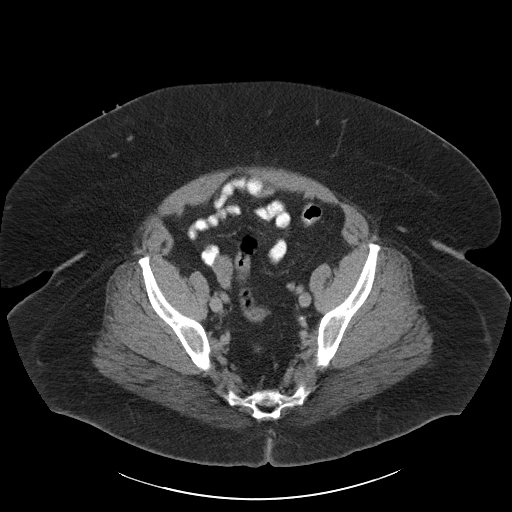
[im 39/91  soft-tissue]
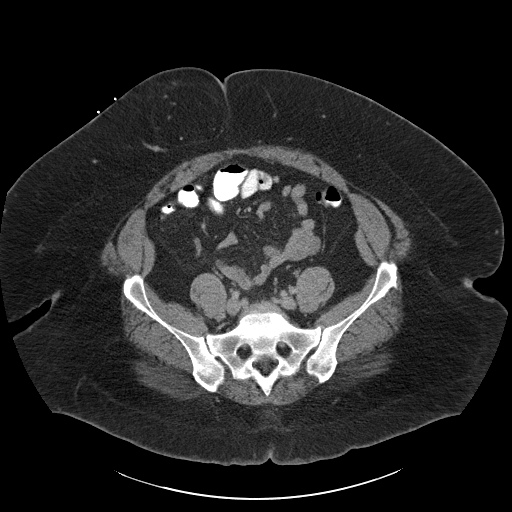
[im 48/91  soft-tissue]
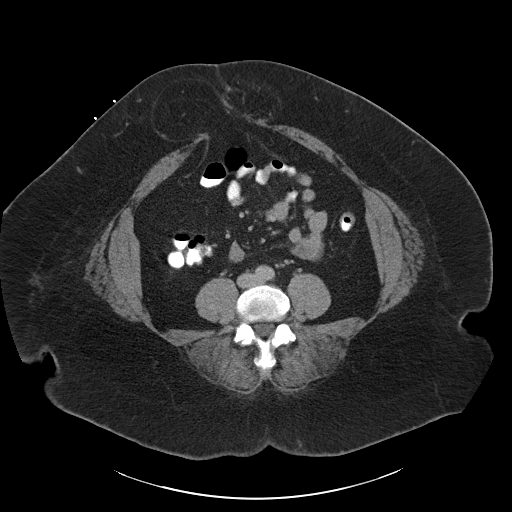
[im 52/91  soft-tissue]
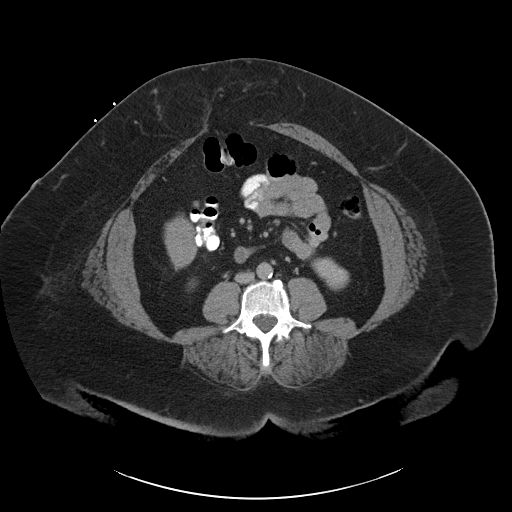
[im 61/91  soft-tissue]
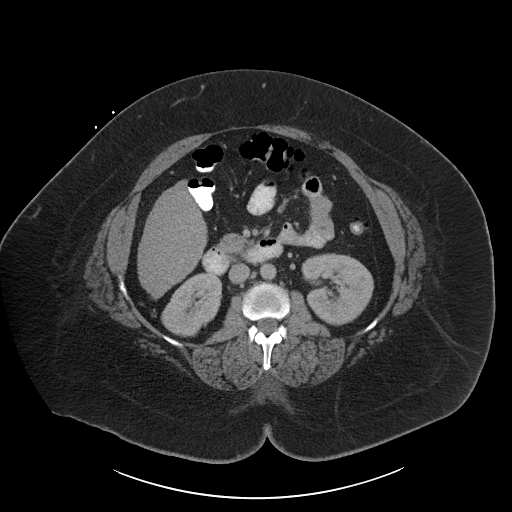
[im 61/91  bone]
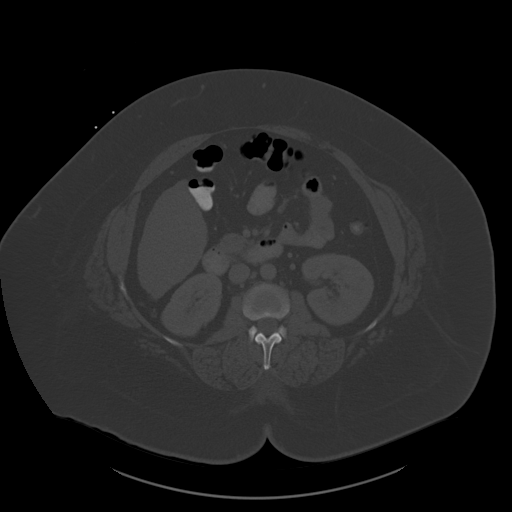
[im 65/91  soft-tissue]
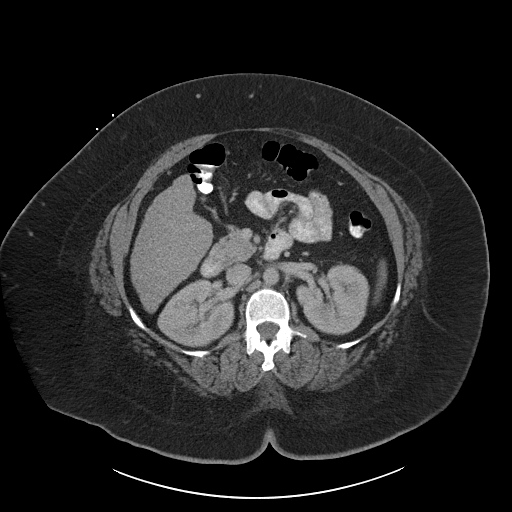
[im 73/91  soft-tissue]
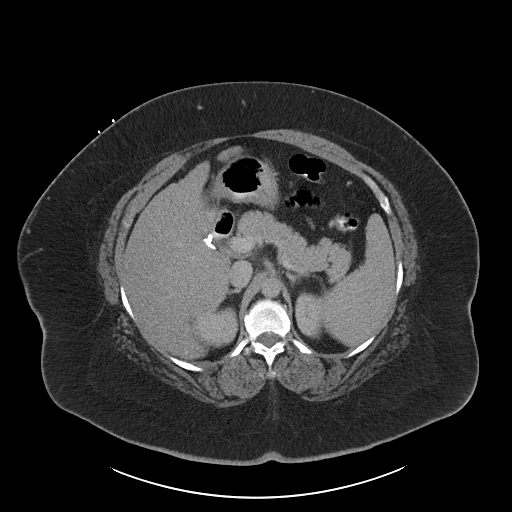
[im 78/91  soft-tissue]
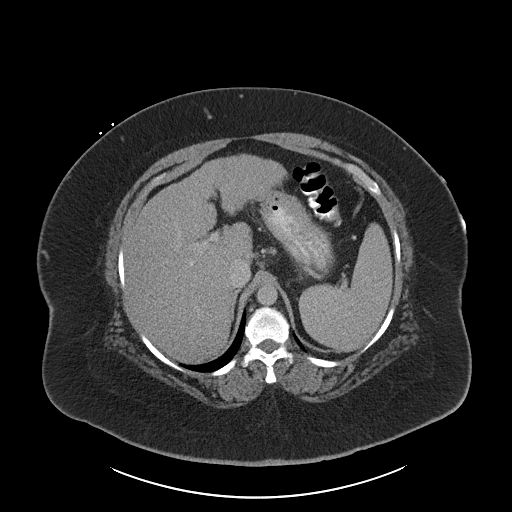
[im 86/91  soft-tissue]
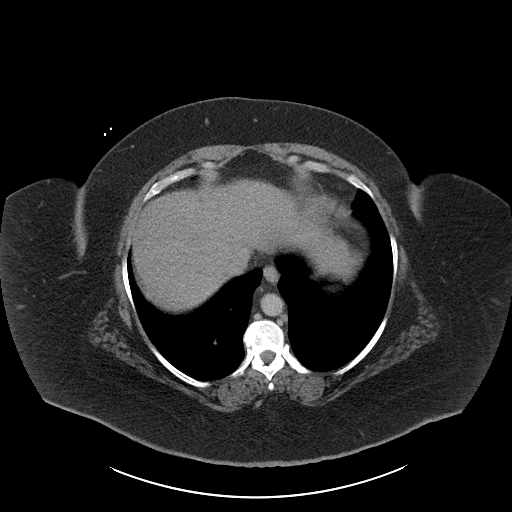

[Series 5: coronals · coronal · 0.76mm/px · 3 of 179 slices shown]
[im 60/179  soft-tissue]
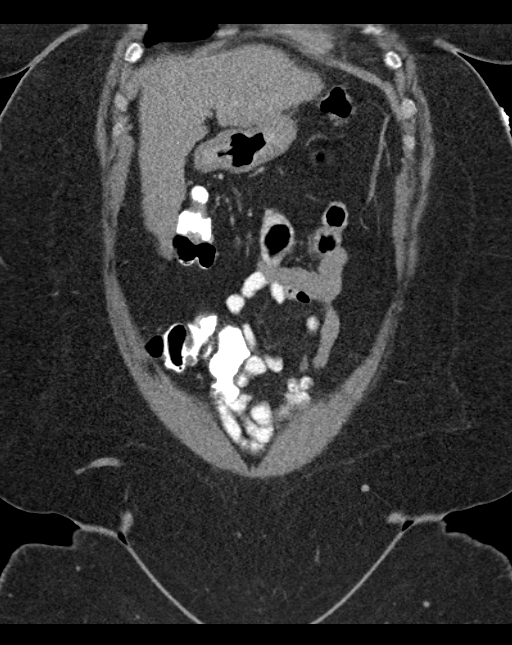
[im 80/179  soft-tissue]
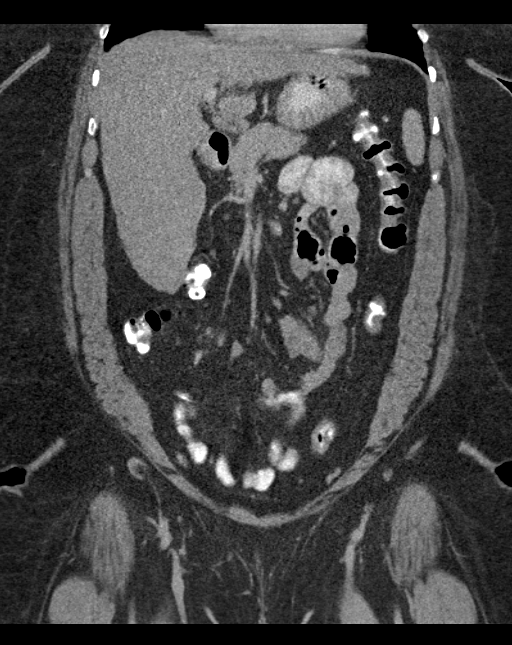
[im 99/179  soft-tissue]
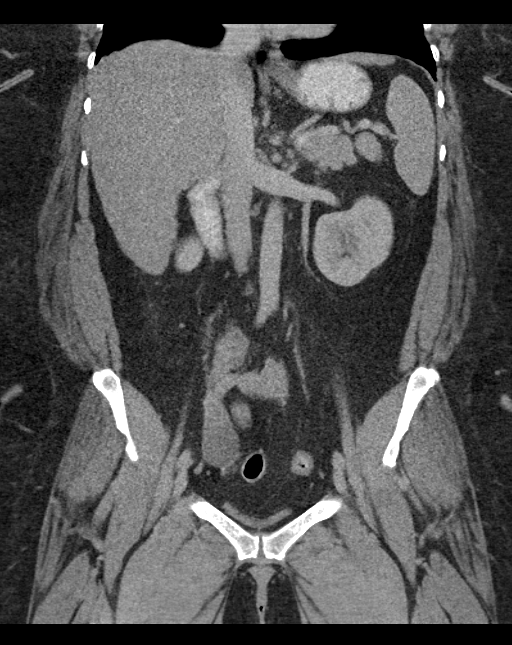

[16 of 46 positions shown; findings below may reference images not displayed]

FINDINGS: Lung bases are unremarkable. There are streaky artifacts from
patient's large body habitus. The patient is status post
cholecystectomy.

Again noted nodular liver contour consistent with cirrhosis. No
intrahepatic biliary ductal dilatation. Splenomegaly again noted
with spleen measuring 13 cm in length. Portal vein measures 1.4 cm
in diameter suspicious for portal hypertension.

Kidneys are symmetrical in size. No hydronephrosis or hydroureter.
The pancreas and adrenal glands are unremarkable.

Again noted periumbilical ventral hernia containing omental fat
measures about 12 cm without evidence of acute complication. There
is no significant change from prior exam.

No small bowel obstruction.  No ascites or free air.  No adenopathy.

The uterus is unremarkable. There is a right ovarian cyst measures
3.6 cm. No inguinal adenopathy. No destructive bony lesions are
noted within pelvis.

Sagittal images of the spine are unremarkable. Delayed renal images
shows bilateral renal symmetrical excretion.
IMPRESSION: 1. Again noted nodular contour of the liver consistent with
cirrhosis. Portal vein measures 1.4 cm in diameter suspicious for
portal hypertension. Mild splenomegaly again noted.
2. Stable periumbilical and supraumbilical ventral hernia containing
omental fat without evidence of acute complication.
3. No hydronephrosis or hydroureter.
4. There is a right ovarian cyst measures 3.6 cm.

## 2016-03-18 IMAGING — CR DG ABDOMEN ACUTE W/ 1V CHEST
4 series · 4 of 4 positions shown · non-contrast
Comparison: 10/27/2013

CLINICAL DATA: Abdominal pain lower umbilical region for 5 days,
initial evaluation, nausea cough shortness of breath, personal
history of hypertension and diabetes

EXAM:
ACUTE ABDOMEN SERIES (ABDOMEN 2 VIEW & CHEST 1 VIEW)

[w chest pa]
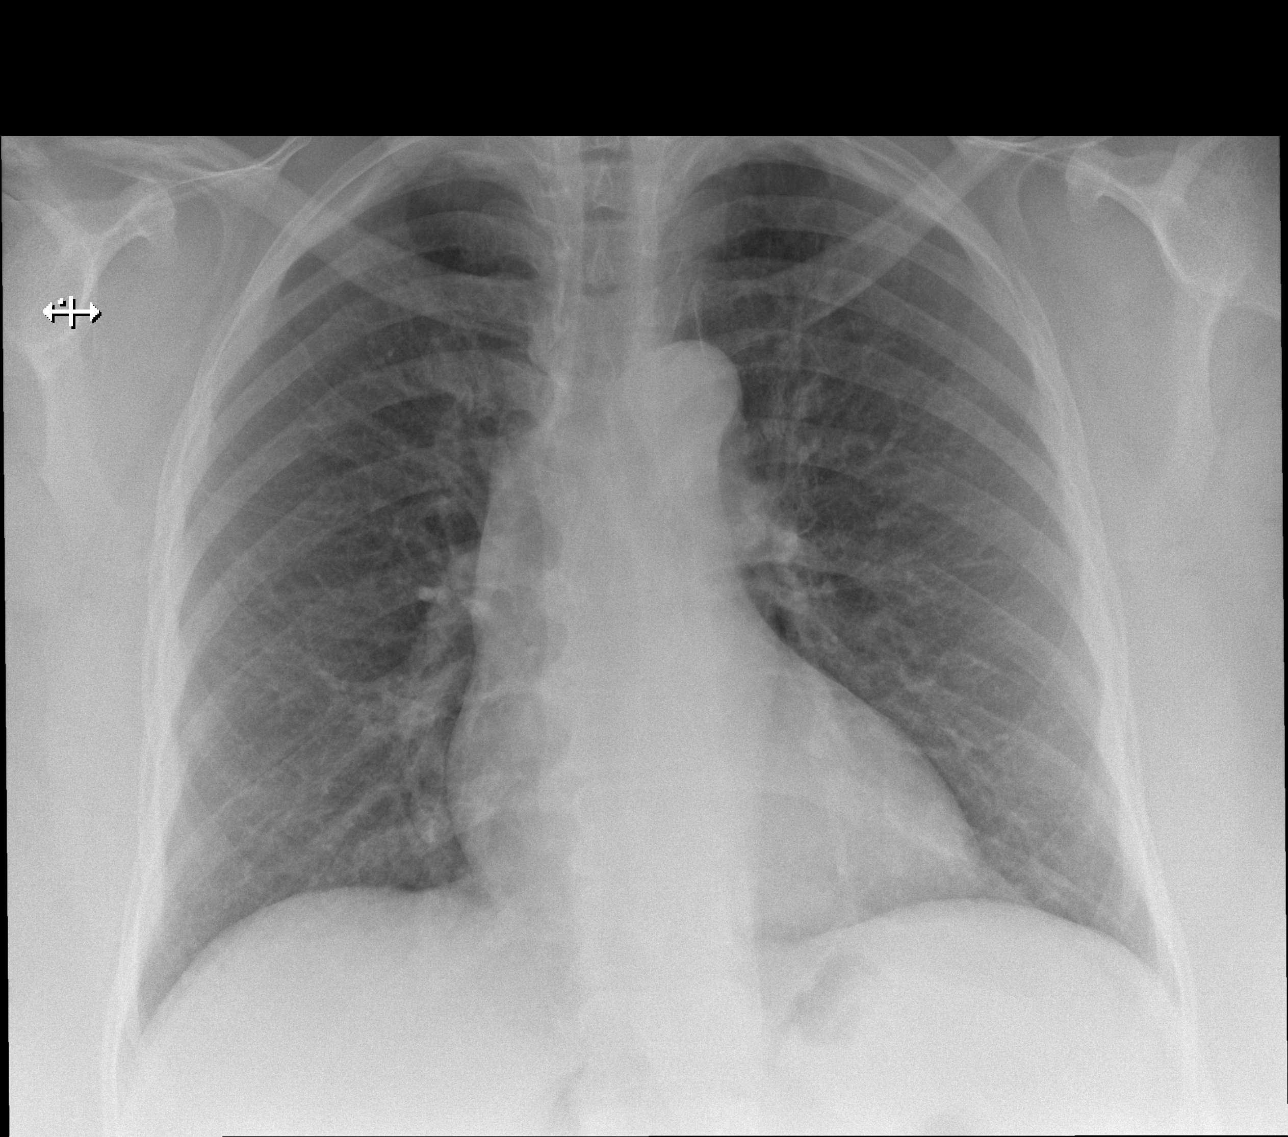

[w abdomen upright]
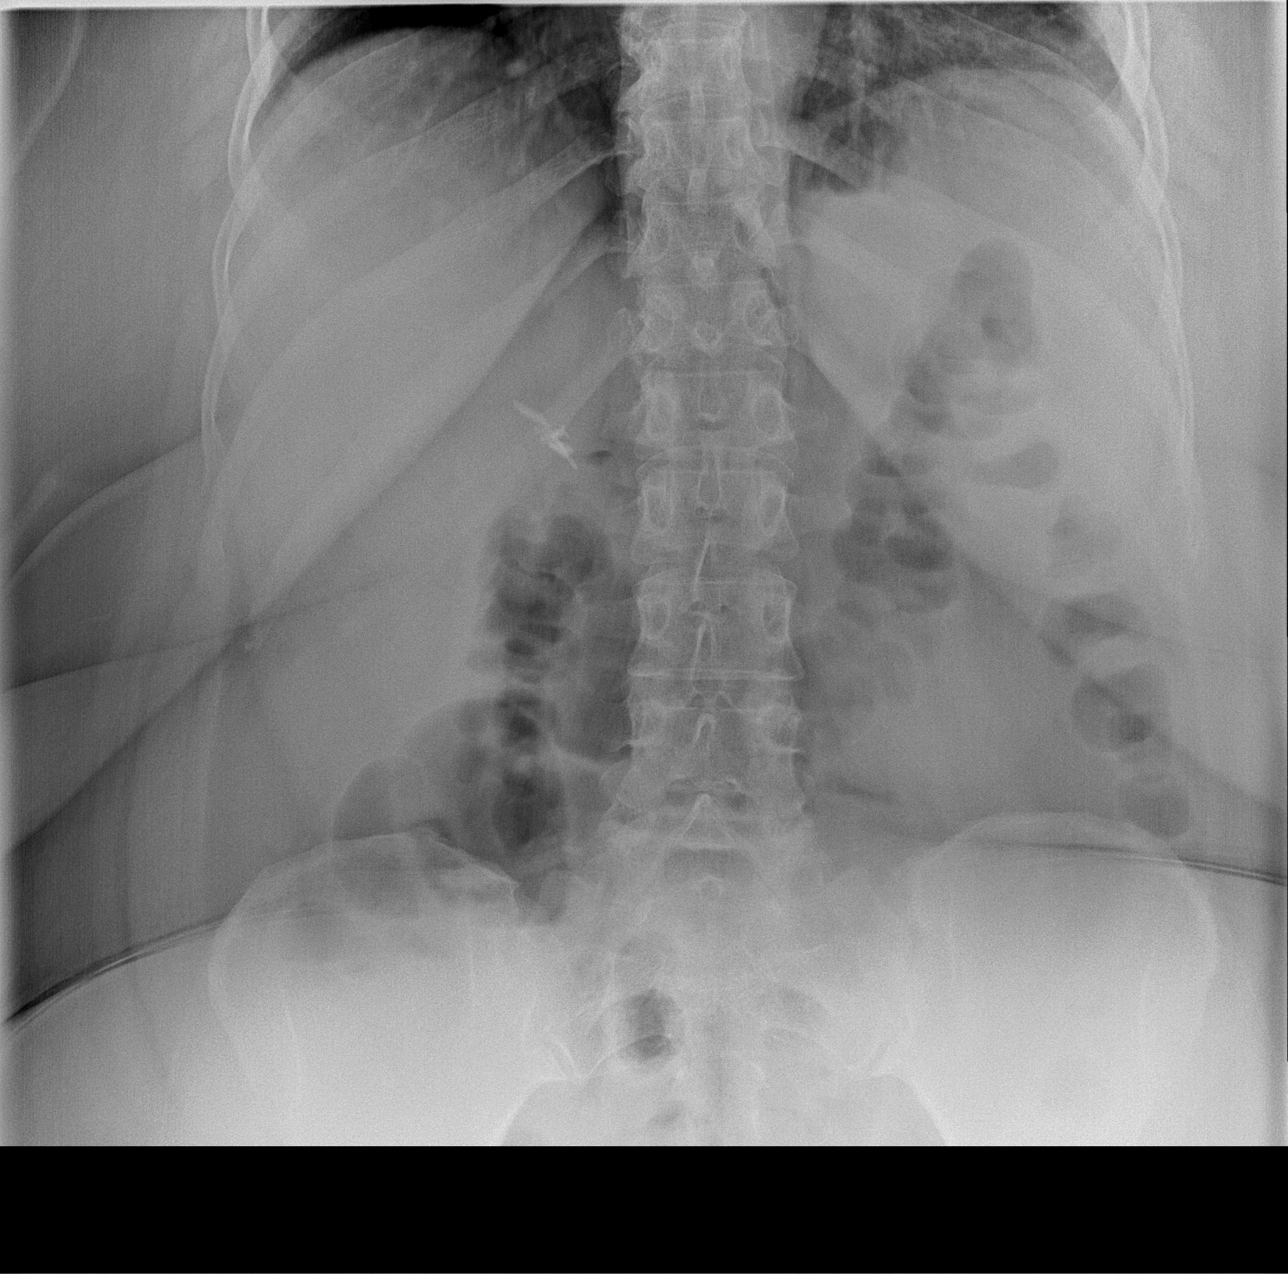

[t abdomen supine (1 of 2)]
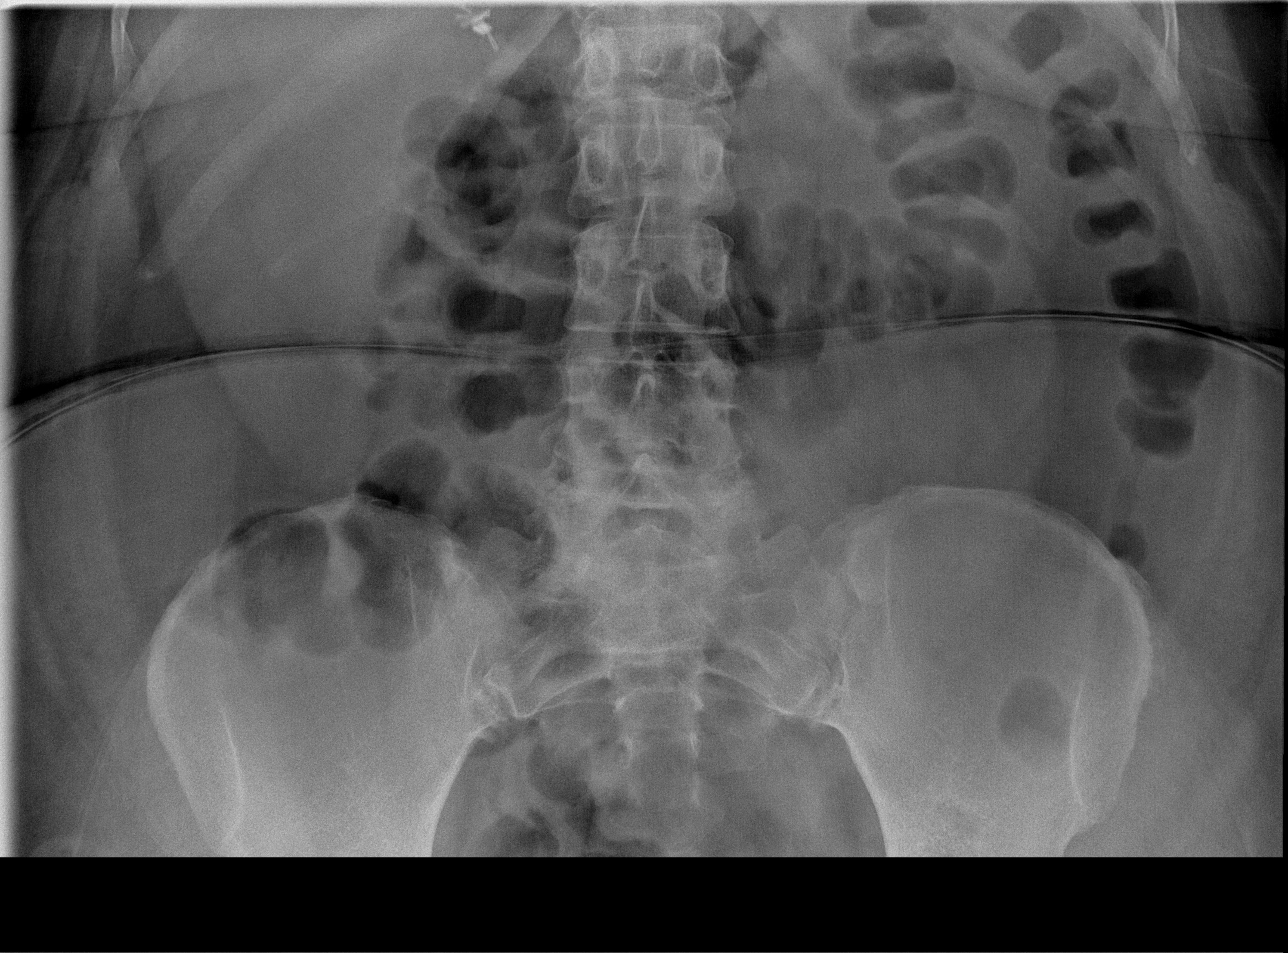

[t abdomen supine (2 of 2)]
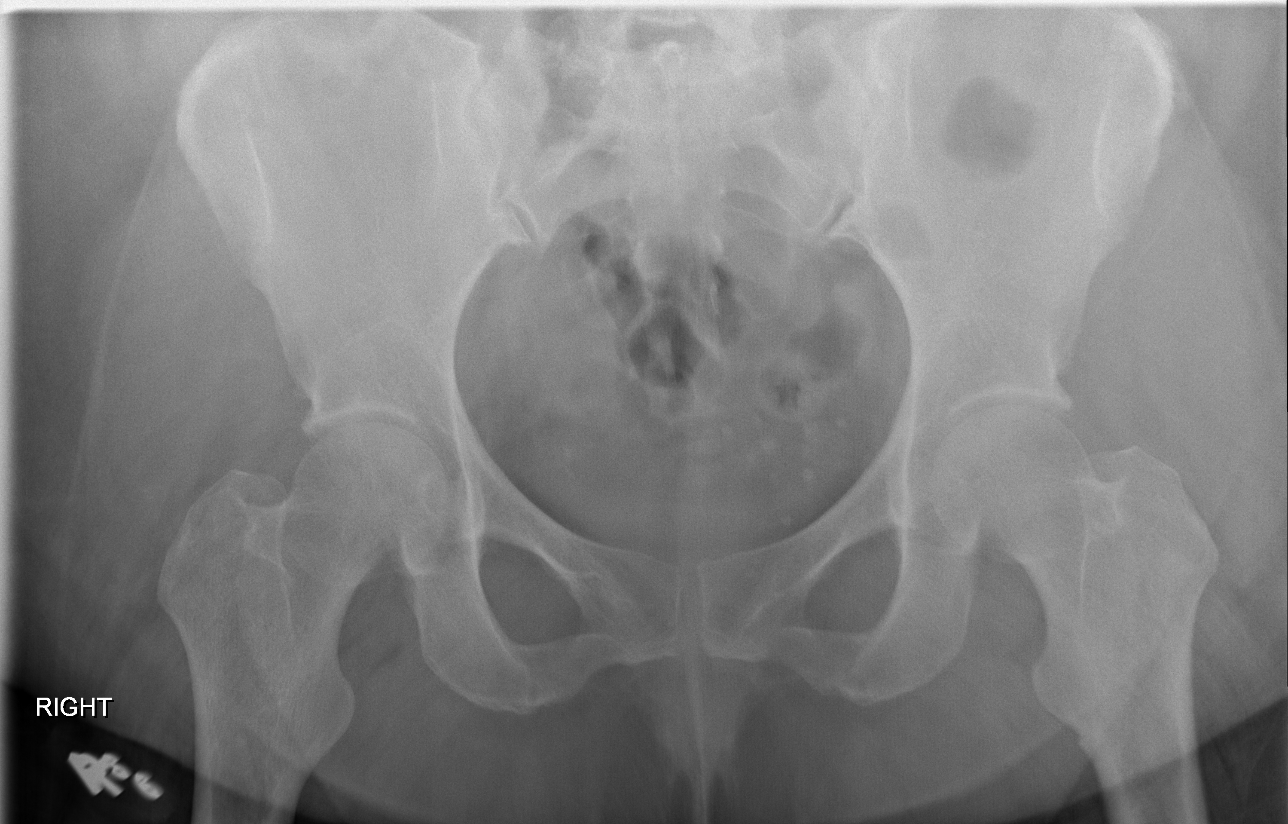

[4 of 4 positions shown; findings below may reference images not displayed]

FINDINGS: Heart size and vascular pattern are normal. Lungs are clear. No free
air in the abdomen.

Right upper quadrant clips consistent with cholecystectomy. There
are no abnormally dilated loops of bowel. There are no abnormal
opacities.
IMPRESSION: Negative abdominal radiographs.  No acute cardiopulmonary disease.

## 2016-03-23 ENCOUNTER — Institutional Professional Consult (permissible substitution): Payer: Medicare Other | Admitting: Pulmonary Disease

## 2016-04-03 ENCOUNTER — Ambulatory Visit (HOSPITAL_COMMUNITY): Admission: RE | Admit: 2016-04-03 | Payer: Medicare Other | Source: Ambulatory Visit

## 2016-04-07 LAB — HM DIABETES EYE EXAM

## 2016-04-21 ENCOUNTER — Other Ambulatory Visit: Payer: Self-pay | Admitting: Family Medicine

## 2016-05-02 ENCOUNTER — Ambulatory Visit: Payer: Medicare Other | Admitting: Family Medicine

## 2016-05-05 ENCOUNTER — Ambulatory Visit (INDEPENDENT_AMBULATORY_CARE_PROVIDER_SITE_OTHER): Payer: Medicare Other | Admitting: Family Medicine

## 2016-05-05 ENCOUNTER — Encounter: Payer: Self-pay | Admitting: Family Medicine

## 2016-05-05 VITALS — BP 124/60 | HR 106 | Temp 98.4°F | Ht 68.0 in | Wt 324.0 lb

## 2016-05-05 DIAGNOSIS — R0600 Dyspnea, unspecified: Secondary | ICD-10-CM

## 2016-05-05 DIAGNOSIS — I10 Essential (primary) hypertension: Secondary | ICD-10-CM | POA: Diagnosis not present

## 2016-05-05 DIAGNOSIS — E1169 Type 2 diabetes mellitus with other specified complication: Secondary | ICD-10-CM | POA: Diagnosis not present

## 2016-05-05 DIAGNOSIS — Z794 Long term (current) use of insulin: Secondary | ICD-10-CM | POA: Diagnosis not present

## 2016-05-05 DIAGNOSIS — E669 Obesity, unspecified: Secondary | ICD-10-CM | POA: Diagnosis not present

## 2016-05-05 DIAGNOSIS — E119 Type 2 diabetes mellitus without complications: Secondary | ICD-10-CM

## 2016-05-05 LAB — POCT GLYCOSYLATED HEMOGLOBIN (HGB A1C): HEMOGLOBIN A1C: 7.1

## 2016-05-05 MED ORDER — TELMISARTAN-HCTZ 80-25 MG PO TABS: 1.0000 | ORAL_TABLET | Freq: Every day | ORAL | 1 refills | Status: DC

## 2016-05-05 MED ORDER — METFORMIN HCL ER 500 MG PO TB24: 500.0000 mg | ORAL_TABLET | Freq: Every day | ORAL | 1 refills | Status: DC

## 2016-05-05 MED ORDER — DILTIAZEM HCL ER BEADS 240 MG PO CP24: ORAL_CAPSULE | ORAL | 1 refills | Status: DC

## 2016-05-05 MED ORDER — SITAGLIPTIN PHOSPHATE 100 MG PO TABS: 100.0000 mg | ORAL_TABLET | Freq: Every day | ORAL | 1 refills | Status: AC

## 2016-05-05 NOTE — Assessment & Plan Note (Signed)
In patient with COPD. She is compliant with her COPD regimen. No wheezing on exam today. No acute finding on respiratory exam. She missed ECHO appointment the last time due to snow. I ordered ECHO again to assess for CHF and an appointment was made for her during today's visit. Return precaution discussed.

## 2016-05-05 NOTE — Assessment & Plan Note (Signed)
Patient's A1C worsened due to non-compliance with insulin. She is unable to tolerate 1000 mg qd. I switch her to a longer acting Metformin 500 mg qd. I increased her Januvia to 100 mg qd. Continue Lantus 26 units qd. F/U in 3-4 weeks for A1C check.

## 2016-05-05 NOTE — Patient Instructions (Signed)
It was nice seeing you today. Your A1C went up. It might be due to the change you made in your metformin regimen. Please discontinue your current Metformin tablet. I have sent a new extended release 500 mg Metformin to your pharmacy, take it once a day. Go up on your Januvia to 100 mg qd and continue current dose of Metformin. I will see you back in 3-4 months. Continue to check your glucose at home.

## 2016-05-05 NOTE — Progress Notes (Signed)
Subjective:     Patient ID: Kristina Hendricks, female   DOB: August 29, 1960, 56 y.o.   MRN: [id number]  Diabetes  She presents for her follow-up diabetic visit. She has type 2 diabetes mellitus. Onset time: many years. Her disease course has been stable. There are no hypoglycemic associated symptoms. Pertinent negatives for hypoglycemia include no headaches. Pertinent negatives for diabetes include no chest pain, no fatigue, no polydipsia, no polyphagia, no polyuria and no visual change. Risk factors for coronary artery disease include diabetes mellitus, dyslipidemia and hypertension. Current diabetic treatments: She takes Metformin 1.5 mg qd instead of 2 tablets daily. She self reduce the dose due to GI symptoms. She is compliant with Lantus 26 units qd and Januvia 50 mg qd. No recent change in her diet. She is compliant with treatment most of the time. Her weight is stable. Her breakfast blood glucose range is generally 110-130 mg/dl. Her lunch blood glucose range is generally 140-180 mg/dl. Her dinner blood glucose range is generally 140-180 mg/dl. An ACE inhibitor/angiotensin II receptor blocker is being taken.  Shortness of Breath  This is a new problem. The current episode started 1 to 4 weeks ago. The problem occurs intermittently. The problem has been waxing and waning. Pertinent negatives include no chest pain, fever, headaches, sputum production or wheezing. Nothing aggravates the symptoms. She has tried beta agonist inhalers (Qvar for COPD) for the symptoms. The treatment provided moderate relief. Her past medical history is significant for COPD.  Hypertension  This is a chronic problem. The current episode started more than 1 year ago. The problem is unchanged. The problem is controlled. Associated symptoms include shortness of breath. Pertinent negatives include no anxiety, chest pain, headaches, palpitations or peripheral edema. There are no associated agents to hypertension. Risk factors for  coronary artery disease include diabetes mellitus, dyslipidemia, obesity and smoking/tobacco exposure. Past treatments include ACE inhibitors, diuretics and beta blockers (Dilatiazem 240 mg qd, Micardis HCT 80/25 mg qd ). The current treatment provides significant improvement.   Current Outpatient Prescriptions on File Prior to Visit  Medication Sig Dispense Refill  . ADVAIR DISKUS 250-50 MCG/DOSE AEPB Inhale 1 puff into the lungs 2 (two) times daily. 60 each 3  . ARIPiprazole (ABILIFY) 30 MG tablet TAKE 1 TABLET(30 MG) BY MOUTH DAILY 90 tablet 1  . aspirin EC 81 MG tablet Take 81 mg by mouth daily.    Marland Kitchen diltiazem (TIAZAC) 240 MG 24 hr capsule TAKE 1 CAPSULE BY MOUTH EVERY DAY 90 capsule 1  . LANTUS SOLOSTAR 100 UNIT/ML Solostar Pen ADMINISTER 26 UNITS UNDER THE SKIN AT BEDTIME 15 mL 3  . pravastatin (PRAVACHOL) 20 MG tablet TAKE 1 TABLET(20 MG) BY MOUTH DAILY 90 tablet 1  . telmisartan-hydrochlorothiazide (MICARDIS HCT) 80-25 MG tablet TAKE 1 TABLET BY MOUTH DAILY 90 tablet 2  . albuterol (PROVENTIL HFA;VENTOLIN HFA) 108 (90 Base) MCG/ACT inhaler Inhale 2 puffs into the lungs every 6 (six) hours as needed for wheezing or shortness of breath. (Patient not taking: Reported on 05/05/2016) 18 g 3  . B-D ULTRAFINE III SHORT PEN 31G X 8 MM MISC USE EVERY DAY TO INJECT INSULIN AS DIRECTED 100 each 2  . ibuprofen (ADVIL,MOTRIN) 800 MG tablet Take 800 mg by mouth every 8 (eight) hours as needed for headache or moderate pain. Reported on 06/29/2015    . mometasone (NASONEX) 50 MCG/ACT nasal spray Place 2 sprays into the nose daily. (Patient not taking: Reported on 05/05/2016) 17 g 5  . ONE TOUCH ULTRA  TEST test strip USE TO CHECK BLOOD SUGAR THREE TIMES DAILY OR AS DIRECTED. 100 each 4  . ranitidine (ZANTAC) 150 MG capsule Take 1 capsule (150 mg total) by mouth 2 (two) times daily. (Patient not taking: Reported on 05/05/2016) 60 capsule 3   No current facility-administered medications on file prior to visit.     Past Medical History:  Diagnosis Date  . Arthritis   . Asthma   . COPD (chronic obstructive pulmonary disease) ([place])   . Diabetes mellitus without complication ([place])   . Hypertension   . Sleep apnea    Vitals:   05/05/16 0849  BP: 124/60  Pulse: (!) 106  Temp: 98.4 F (36.9 C)  TempSrc: Oral  SpO2: 98%  Weight: (!) 324 lb (147 kg)  Height: 5\' 8"  (1.727 m)     Review of Systems  Constitutional: Negative for fatigue and fever.  Respiratory: Positive for shortness of breath. Negative for sputum production and wheezing.   Cardiovascular: Negative for chest pain and palpitations.  Gastrointestinal: Negative.   Endocrine: Negative for polydipsia, polyphagia and polyuria.  Genitourinary: Negative.   Neurological: Negative for headaches.  All other systems reviewed and are negative.      Objective:   Physical Exam  Constitutional: She is oriented to person, place, and time. She appears well-developed. No distress.  Cardiovascular: Normal rate, regular rhythm and normal heart sounds.   No murmur heard. Pulmonary/Chest: Effort normal and breath sounds normal. No respiratory distress. She has no wheezes.  Abdominal: Soft. She exhibits no distension and no mass. There is no tenderness.  Musculoskeletal: Normal range of motion. She exhibits no edema.  Neurological: She is alert and oriented to person, place, and time.  Nursing note and vitals reviewed.      Assessment:     DM2 Dyspnea: COPD HTN    Plan:     Check problem list.

## 2016-05-05 NOTE — Assessment & Plan Note (Signed)
BP optimal. I refilled her meds.

## 2016-05-08 ENCOUNTER — Encounter: Payer: Self-pay | Admitting: Family Medicine

## 2016-05-22 ENCOUNTER — Other Ambulatory Visit: Payer: Self-pay | Admitting: Family Medicine

## 2016-05-26 ENCOUNTER — Ambulatory Visit (HOSPITAL_COMMUNITY)
Admission: RE | Admit: 2016-05-26 | Discharge: 2016-05-26 | Disposition: A | Discharge status: Home / Self Care | Payer: Medicare Other | Source: Ambulatory Visit | Attending: Family Medicine | Admitting: Family Medicine

## 2016-05-26 DIAGNOSIS — J449 Chronic obstructive pulmonary disease, unspecified: Secondary | ICD-10-CM | POA: Insufficient documentation

## 2016-05-26 DIAGNOSIS — R0600 Dyspnea, unspecified: Secondary | ICD-10-CM | POA: Diagnosis not present

## 2016-05-26 DIAGNOSIS — Z6841 Body Mass Index (BMI) 40.0 and over, adult: Secondary | ICD-10-CM | POA: Insufficient documentation

## 2016-05-26 DIAGNOSIS — I10 Essential (primary) hypertension: Secondary | ICD-10-CM | POA: Diagnosis not present

## 2016-05-26 DIAGNOSIS — I071 Rheumatic tricuspid insufficiency: Secondary | ICD-10-CM | POA: Insufficient documentation

## 2016-05-26 DIAGNOSIS — J45909 Unspecified asthma, uncomplicated: Secondary | ICD-10-CM | POA: Insufficient documentation

## 2016-05-26 DIAGNOSIS — E119 Type 2 diabetes mellitus without complications: Secondary | ICD-10-CM | POA: Insufficient documentation

## 2016-05-26 NOTE — Progress Notes (Signed)
  Echocardiogram 2D Echocardiogram has been performed.  Kristina Hendricks 05/26/2016, 9:42 AM

## 2016-05-28 ENCOUNTER — Encounter: Payer: Self-pay | Admitting: Family Medicine

## 2016-07-03 ENCOUNTER — Other Ambulatory Visit: Payer: Self-pay | Admitting: Family Medicine

## 2016-07-04 ENCOUNTER — Ambulatory Visit (INDEPENDENT_AMBULATORY_CARE_PROVIDER_SITE_OTHER): Payer: Medicare Other | Admitting: *Deleted

## 2016-07-04 ENCOUNTER — Encounter: Payer: Self-pay | Admitting: *Deleted

## 2016-07-04 VITALS — BP 106/64 | HR 81 | Temp 98.6°F | Ht 68.0 in | Wt 326.2 lb

## 2016-07-04 DIAGNOSIS — Z0000 Encounter for general adult medical examination without abnormal findings: Secondary | ICD-10-CM | POA: Diagnosis not present

## 2016-07-04 NOTE — Progress Notes (Addendum)
[place] ATTENDING NOTE Ronette Deter I   reviewed their chart. I have discussed this patient with the RN. I agree with the RN's findings, assessment and care plan.  She will need face to face encounter for DME. Please help schedule appointment. Thanks.

## 2016-07-04 NOTE — Progress Notes (Signed)
 Subjective:   Kristina Hendricks is a 56 y.o. female who presents for an Initial Medicare Annual Wellness Visit.   Cardiac Risk Factors include: diabetes mellitus;dyslipidemia;hypertension;obesity (BMI >30kg/m2);sedentary lifestyle     Objective:    Today's Vitals   07/04/16 0946 07/04/16 0948  BP:  106/64  Pulse:  81  Temp:  98.6 F (37 C)  TempSrc:  Oral  SpO2:  97%  Weight: (!) 326 lb 3.2 oz (148 kg)   Height: 5\' 8"  (1.727 m)   PainSc:  8    Body mass index is 49.6 kg/m.   Current Medications (verified) Outpatient Encounter Prescriptions as of 07/04/2016  Medication Sig  . ADVAIR DISKUS 250-50 MCG/DOSE AEPB Inhale 1 puff into the lungs 2 (two) times daily.  Marland Kitchen albuterol (PROVENTIL HFA;VENTOLIN HFA) 108 (90 Base) MCG/ACT inhaler Inhale 2 puffs into the lungs every 6 (six) hours as needed for wheezing or shortness of breath.  . ARIPiprazole (ABILIFY) 30 MG tablet TAKE 1 TABLET(30 MG) BY MOUTH DAILY  . aspirin EC 81 MG tablet Take 81 mg by mouth daily.  . B-D ULTRAFINE III SHORT PEN 31G X 8 MM MISC USE EVERY DAY TO INJECT INSULIN AS DIRECTED  . diltiazem (TIAZAC) 240 MG 24 hr capsule TAKE 1 CAPSULE BY MOUTH EVERY DAY  . LANTUS SOLOSTAR 100 UNIT/ML Solostar Pen ADMINISTER 26 UNITS UNDER THE SKIN AT BEDTIME  . metFORMIN (GLUCOPHAGE-XR) 500 MG 24 hr tablet Take 1 tablet (500 mg total) by mouth daily with breakfast.  . mometasone (NASONEX) 50 MCG/ACT nasal spray Place 2 sprays into the nose daily.  . ONE TOUCH ULTRA TEST test strip USE TO CHECK BLOOD SUGAR THREE TIMES DAILY OR AS DIRECTED.  Marland Kitchen pravastatin (PRAVACHOL) 20 MG tablet TAKE 1 TABLET(20 MG) BY MOUTH DAILY  . ranitidine (ZANTAC) 150 MG capsule Take 1 capsule (150 mg total) by mouth 2 (two) times daily.  . sitaGLIPtin (JANUVIA) 100 MG tablet Take 1 tablet (100 mg total) by mouth daily.  Marland Kitchen telmisartan-hydrochlorothiazide (MICARDIS HCT) 80-25 MG tablet Take 1 tablet by mouth daily.  . busPIRone (BUSPAR) 7.5 MG tablet TAKE 1  TABLET(7.5 MG) BY MOUTH TWICE DAILY (Patient not taking: Reported on 07/04/2016)  . ibuprofen (ADVIL,MOTRIN) 800 MG tablet Take 800 mg by mouth every 8 (eight) hours as needed for headache or moderate pain. Reported on 06/29/2015   No facility-administered encounter medications on file as of 07/04/2016.     Allergies (verified) Oxycodone-acetaminophen   History: Past Medical History:  Diagnosis Date  . Arthritis   . Asthma   . COPD (chronic obstructive pulmonary disease) ([place])   . Diabetes mellitus without complication ([place])   . Hyperlipidemia   . Hypertension   . Sleep apnea    Past Surgical History:  Procedure Laterality Date  . CHOLECYSTECTOMY  2003  . TUBAL LIGATION  1984   Family History  Problem Relation Age of Onset  . Alcohol abuse Mother   . Drug abuse Mother   . Cancer Mother   . Depression Mother   . Diabetes Mother   . Hypertension Mother   . Mental illness Mother   . Heart disease Father   . Depression Father   . Diabetes Father   . Hypertension Father   . Arthritis Sister   . Hypertension Brother   . Mental illness Brother   . Arthritis Sister   . Hypertension Sister   . Hypertension Sister   . Hypertension Brother   . Hypertension Brother  Social History   Occupational History  . Not on file.   Social History Main Topics  . Smoking status: Former Smoker    Packs/day: 1.00    Years: 9.00    Types: Cigarettes    Quit date: 10/24/2011  . Smokeless tobacco: Never Used  . Alcohol use No  . Drug use: No  . Sexual activity: Yes    Partners: Male    Tobacco Counseling Counseling given: Yes Patient is former smoker with no plans to restart   Activities of Daily Living In your present state of health, do you have any difficulty performing the following activities: 07/04/2016  Hearing? N  Vision? Y  Difficulty concentrating or making decisions? N  Walking or climbing stairs? Y  Dressing or bathing? N  Doing errands, shopping? Y  Preparing  Food and eating ? N  Using the Toilet? N  In the past six months, have you accidently leaked urine? N  Do you have problems with loss of bowel control? N  Managing your Medications? N  Managing your Finances? N  Housekeeping or managing your Housekeeping? N  Some recent data might be hidden   Home Safety:  My home has a working smoke alarm:  Yes X 2           My home throw rugs have been fastened down to the floor or removed:  Removed I have a non-slip surface or non-slip mats in the bathtub and shower:  Yes plus grab bars for shower and toilet        All my home's stairs have handrails, including any outdoor stairs  First floor handicap equipped apt with 2 outside steps with handrails          My home's floors, stairs and hallways are free from clutter, wires and cords:  Yes     I have animals in my home  No I wear seatbelts consistently:  Yes   Immunizations and Health Maintenance Immunization History  Administered Date(s) Administered  . Hepatitis A 02/04/2014  . Hepatitis A, Adult 10/08/2014  . Hepatitis B 02/04/2014  . Hepatitis B, adult 03/12/2014, 10/08/2014  . Influenza,inj,Quad PF,36+ Mos 10/24/2013, 09/29/2014, 11/26/2015  . Pneumococcal Polysaccharide-23 02/04/2014   Health Maintenance Due  Topic Date Due  . TETANUS/TDAP  01/09/1982  . FOOT EXAM  06/28/2016   Patient will obtain TDaP at local pharmacy Foot exam done today  Diabetic Foot Exam - Simple   Simple Foot Form Diabetic Foot exam was performed with the following findings:  Yes 07/04/2016 10:30 AM  Visual Inspection No deformities, no ulcerations, no other skin breakdown bilaterally:  Yes Sensation Testing See comments:  Yes Pulse Check Posterior Tibialis and Dorsalis pulse intact bilaterally:  Yes Comments All areas intact to monofilament testing except right great toe. States she lost sensation following ankle surgery. Hubbard Hartshorn, RN, BSN       Patient Care Team: Kinnie Feil, MD as PCP -  General (Family Medicine) Arta Silence, MD as Consulting Physician (Gastroenterology) Dr. Lloyd Huger for ortho  Indicate any recent Medical Services you may have received from other than [facility] providers in the past year (date may be approximate).     Assessment:   This is a routine wellness examination for Kristina Hendricks.   Hearing/Vision screen  Hearing Screening   Method: Audiometry   125Hz  250Hz  500Hz  1000Hz  2000Hz  3000Hz  4000Hz  6000Hz  8000Hz   Right ear:   40 25 40  40    Left ear:  40 40 40  25      Dietary issues and exercise activities discussed: Current Exercise Habits: Home exercise routine, Exercise limited by: orthopedic condition(s) (Left knee and right ankle pain)  Goals    . Blood Pressure < 130/90          In a diabetic    . Exercise 2x per week (30 min per time)          Walk    . Weight (lb) < 309 lb (140.2 kg) (pt-stated)          5% weight loss     Discussed recording consumption intake to assist with desired 5% weight loss. Recommended MyPLate or My Fitness Pal apps to assist with recording. Discussed engaging in physical activity 150 min/week Patient has Silver Sneakers through [facility] Patient has sister who has been asking her to exercise with her.  Depression Screen PHQ 2/9 Scores 07/04/2016 05/05/2016 02/04/2016 12/31/2015 11/26/2015 09/10/2015 06/29/2015  PHQ - 2 Score 4 0 5 4 3  0 0  PHQ- 9 Score 16 - 19 16 10  - -  Patient sees Dr. Gwenlyn Saran for IC/BH   Fall Risk Fall Risk  07/04/2016 10/08/2014 08/28/2014 05/28/2014 04/09/2014  Falls in the past year? No Yes Yes No No  Number falls in past yr: - 2 or more 2 or more - -  Injury with Fall? - Yes No - -  Risk Factor Category  - High Fall Risk - - -  Risk for fall due to : Impaired balance/gait;Impaired mobility History of fall(s);Impaired balance/gait Other (Comment) - -  Risk for fall due to (comments): - - Obesity - -  Follow up - Falls evaluation completed;Education provided Falls prevention discussed;Follow up  appointment - -   TUG Test:  Done in 12 seconds. Patient used both hands to sit back down only. Lost balance upon standing. Pronounced limp 2/2 right ankle pain. Patient would like to have 3 prong cane. Will send message to PCP. Falls prevention discussed in detail and literature given.  Cognitive Function: Mini-Cog  Failed with score 2/5   Screening Tests Health Maintenance  Topic Date Due  . TETANUS/TDAP  01/09/1982  . FOOT EXAM  06/28/2016  . INFLUENZA VACCINE  08/23/2016  . HEMOGLOBIN A1C    . PAP SMEAR  02/27/2017  . OPHTHALMOLOGY EXAM  04/07/2017  . MAMMOGRAM  02/13/2018  . PNEUMOCOCCAL POLYSACCHARIDE VACCINE (2) 02/05/2019  . COLONOSCOPY  05/27/2024  . Hepatitis C Screening  Completed  . HIV Screening  Completed      Plan:    Patient requesting 3 prong cane to help with balance and prevent falls.  Patient c/o being "out of energy" by midday and right hand tremor X 2 months. Encouraged to make f/u with PCP to discuss.  I have personally reviewed and noted the following in the patient's chart:   . Medical and social history . Use of alcohol, tobacco or illicit drugs  . Current medications and supplements . Functional ability and status . Nutritional status . Physical activity . Advanced directives . List of other physicians . Hospitalizations, surgeries, and ER visits in previous 12 months . Vitals . Screenings to include cognitive, depression, and falls . Referrals and appointments  In addition, I have reviewed and discussed with patient certain preventive protocols, quality metrics, and best practice recommendations. A written personalized care plan for preventive services as well as general preventive health recommendations were provided to patient.     DUCATTE, LAURENZE L,  RN   07/04/2016

## 2016-07-04 NOTE — Patient Instructions (Addendum)
Kristina Hendricks,  Thank you for taking time to come for yourMedicare Wellness Visit. I appreciate your ongoing commitment to your health goals. Please review the following plan we discussed and let me know if I can assist you in the future.   These are the goals we discussed:  Goals    . Blood Pressure < 130/90          In a diabetic    . Exercise 2x per week (30 min per time)          Walk    . Weight (lb) < 309 lb (140.2 kg) (pt-stated)          5% weight loss         Diabetes and Foot Care Diabetes may cause you to have problems because of poor blood supply (circulation) to your feet and legs. This may cause the skin on your feet to become thinner, break easier, and heal more slowly. Your skin may become dry, and the skin may peel and crack. You may also have nerve damage in your legs and feet causing decreased feeling in them. You may not notice minor injuries to your feet that could lead to infections or more serious problems. Taking care of your feet is one of the most important things you can do for yourself. Follow these instructions at home:  Wear shoes at all times, even in the house. Do not go barefoot. Bare feet are easily injured.  Check your feet daily for blisters, cuts, and redness. If you cannot see the bottom of your feet, use a mirror or ask someone for help.  Wash your feet with warm water (do not use hot water) and mild soap. Then pat your feet and the areas between your toes until they are completely dry. Do not soak your feet as this can dry your skin.  Apply a moisturizing lotion or petroleum jelly (that does not contain alcohol and is unscented) to the skin on your feet and to dry, brittle toenails. Do not apply lotion between your toes.  Trim your toenails straight across. Do not dig under them or around the cuticle. File the edges of your nails with an emery board or nail file.  Do not cut corns or calluses or try to remove them with medicine.  Wear  clean socks or stockings every day. Make sure they are not too tight. Do not wear knee-high stockings since they may decrease blood flow to your legs.  Wear shoes that fit properly and have enough cushioning. To break in new shoes, wear them for just a few hours a day. This prevents you from injuring your feet. Always look in your shoes before you put them on to be sure there are no objects inside.  Do not cross your legs. This may decrease the blood flow to your feet.  If you find a minor scrape, cut, or break in the skin on your feet, keep it and the skin around it clean and dry. These areas may be cleansed with mild soap and water. Do not cleanse the area with peroxide, alcohol, or iodine.  When you remove an adhesive bandage, be sure not to damage the skin around it.  If you have a wound, look at it several times a day to make sure it is healing.  Do not use heating pads or hot water bottles. They may burn your skin. If you have lost feeling in your feet or legs, you may not know  it is happening until it is too late.  Make sure your health care provider performs a complete foot exam at least annually or more often if you have foot problems. Report any cuts, sores, or bruises to your health care provider immediately. Contact a health care provider if:  You have an injury that is not healing.  You have cuts or breaks in the skin.  You have an ingrown nail.  You notice redness on your legs or feet.  You feel burning or tingling in your legs or feet.  You have pain or cramps in your legs and feet.  Your legs or feet are numb.  Your feet always feel cold. Get help right away if:  There is increasing redness, swelling, or pain in or around a wound.  There is a red line that goes up your leg.  Pus is coming from a wound.  You develop a fever or as directed by your health care provider.  You notice a bad smell coming from an ulcer or wound. This information is not intended to  replace advice given to you by your health care provider. Make sure you discuss any questions you have with your health care provider. Document Released: 01/07/2000 Document Revised: 06/17/2015 Document Reviewed: 06/18/2012 Elsevier Interactive Patient Education  2017 [place] Prevention in the Home Falls can cause injuries. They can happen to people of all ages. There are many things you can do to make your home safe and to help prevent falls. What can I do on the outside of my home?  Regularly fix the edges of walkways and driveways and fix any cracks.  Remove anything that might make you trip as you walk through a door, such as a raised step or threshold.  Trim any bushes or trees on the path to your home.  Use bright outdoor lighting.  Clear any walking paths of anything that might make someone trip, such as rocks or tools.  Regularly check to see if handrails are loose or broken. Make sure that both sides of any steps have handrails.  Any raised decks and porches should have guardrails on the edges.  Have any leaves, snow, or ice cleared regularly.  Use sand or salt on walking paths during winter.  Clean up any spills in your garage right away. This includes oil or grease spills. What can I do in the bathroom?  Use night lights.  Install grab bars by the toilet and in the tub and shower. Do not use towel bars as grab bars.  Use non-skid mats or decals in the tub or shower.  If you need to sit down in the shower, use a plastic, non-slip stool.  Keep the floor dry. Clean up any water that spills on the floor as soon as it happens.  Remove soap buildup in the tub or shower regularly.  Attach bath mats securely with double-sided non-slip rug tape.  Do not have throw rugs and other things on the floor that can make you trip. What can I do in the bedroom?  Use night lights.  Make sure that you have a light by your bed that is easy to reach.  Do not use  any sheets or blankets that are too big for your bed. They should not hang down onto the floor.  Have a firm chair that has side arms. You can use this for support while you get dressed.  Do not have throw rugs and other things  on the floor that can make you trip. What can I do in the kitchen?  Clean up any spills right away.  Avoid walking on wet floors.  Keep items that you use a lot in easy-to-reach places.  If you need to reach something above you, use a strong step stool that has a grab bar.  Keep electrical cords out of the way.  Do not use floor polish or wax that makes floors slippery. If you must use wax, use non-skid floor wax.  Do not have throw rugs and other things on the floor that can make you trip. What can I do with my stairs?  Do not leave any items on the stairs.  Make sure that there are handrails on both sides of the stairs and use them. Fix handrails that are broken or loose. Make sure that handrails are as long as the stairways.  Check any carpeting to make sure that it is firmly attached to the stairs. Fix any carpet that is loose or worn.  Avoid having throw rugs at the top or bottom of the stairs. If you do have throw rugs, attach them to the floor with carpet tape.  Make sure that you have a light switch at the top of the stairs and the bottom of the stairs. If you do not have them, ask someone to add them for you. What else can I do to help prevent falls?  Wear shoes that: ? Do not have high heels. ? Have rubber bottoms. ? Are comfortable and fit you well. ? Are closed at the toe. Do not wear sandals.  If you use a stepladder: ? Make sure that it is fully opened. Do not climb a closed stepladder. ? Make sure that both sides of the stepladder are locked into place. ? Ask someone to hold it for you, if possible.  Clearly mark and make sure that you can see: ? Any grab bars or handrails. ? First and last steps. ? Where the edge of each step  is.  Use tools that help you move around (mobility aids) if they are needed. These include: ? Canes. ? Walkers. ? Scooters. ? Crutches.  Turn on the lights when you go into a dark area. Replace any light bulbs as soon as they burn out.  Set up your furniture so you have a clear path. Avoid moving your furniture around.  If any of your floors are uneven, fix them.  If there are any pets around you, be aware of where they are.  Review your medicines with your doctor. Some medicines can make you feel dizzy. This can increase your chance of falling. Ask your doctor what other things that you can do to help prevent falls. This information is not intended to replace advice given to you by your health care provider. Make sure you discuss any questions you have with your health care provider. Document Released: 11/05/2008 Document Revised: 06/17/2015 Document Reviewed: 02/13/2014 Elsevier Interactive Patient Education  2018 [place] Maintenance, Female Adopting a healthy lifestyle and getting preventive care can go a long way to promote health and wellness. Talk with your health care provider about what schedule of regular examinations is right for you. This is a good chance for you to check in with your provider about disease prevention and staying healthy. In between checkups, there are plenty of things you can do on your own. Experts have done a lot of research about which lifestyle changes and  preventive measures are most likely to keep you healthy. Ask your health care provider for more information. Weight and diet Eat a healthy diet  Be sure to include plenty of vegetables, fruits, low-fat dairy products, and lean protein.  Do not eat a lot of foods high in solid fats, added sugars, or salt.  Get regular exercise. This is one of the most important things you can do for your health. ? Most adults should exercise for at least 150 minutes each week. The exercise should  increase your heart rate and make you sweat (moderate-intensity exercise). ? Most adults should also do strengthening exercises at least twice a week. This is in addition to the moderate-intensity exercise.  Maintain a healthy weight  Body mass index (BMI) is a measurement that can be used to identify possible weight problems. It estimates body fat based on height and weight. Your health care provider can help determine your BMI and help you achieve or maintain a healthy weight.  For females 64 years of age and older: ? A BMI below 18.5 is considered underweight. ? A BMI of 18.5 to 24.9 is normal. ? A BMI of 25 to 29.9 is considered overweight. ? A BMI of 30 and above is considered obese.  Watch levels of cholesterol and blood lipids  You should start having your blood tested for lipids and cholesterol at 54 years of age, then have this test every 5 years.  You may need to have your cholesterol levels checked more often if: ? Your lipid or cholesterol levels are high. ? You are older than 54 years of age. ? You are at high risk for heart disease.  Cancer screening Lung Cancer  Lung cancer screening is recommended for adults 44-13 years old who are at high risk for lung cancer because of a history of smoking.  A yearly low-dose CT scan of the lungs is recommended for people who: ? Currently smoke. ? Have quit within the past 15 years. ? Have at least a 30-pack-year history of smoking. A pack year is smoking an average of one pack of cigarettes a day for 1 year.  Yearly screening should continue until it has been 15 years since you quit.  Yearly screening should stop if you develop a health problem that would prevent you from having lung cancer treatment.  Breast Cancer  Practice breast self-awareness. This means understanding how your breasts normally appear and feel.  It also means doing regular breast self-exams. Let your health care provider know about any changes, no matter  how small.  If you are in your 20s or 30s, you should have a clinical breast exam (CBE) by a health care provider every 1-3 years as part of a regular health exam.  If you are 51 or older, have a CBE every year. Also consider having a breast X-ray (mammogram) every year.  If you have a family history of breast cancer, talk to your health care provider about genetic screening.  If you are at high risk for breast cancer, talk to your health care provider about having an MRI and a mammogram every year.  Breast cancer gene (BRCA) assessment is recommended for women who have family members with BRCA-related cancers. BRCA-related cancers include: ? Breast. ? Ovarian. ? Tubal. ? Peritoneal cancers.  Results of the assessment will determine the need for genetic counseling and BRCA1 and BRCA2 testing.  Cervical Cancer Your health care provider may recommend that you be screened regularly for cancer of  the pelvic organs (ovaries, uterus, and vagina). This screening involves a pelvic examination, including checking for microscopic changes to the surface of your cervix (Pap test). You may be encouraged to have this screening done every 3 years, beginning at age 57.  For women ages 26-65, health care providers may recommend pelvic exams and Pap testing every 3 years, or they may recommend the Pap and pelvic exam, combined with testing for human papilloma virus (HPV), every 5 years. Some types of HPV increase your risk of cervical cancer. Testing for HPV may also be done on women of any age with unclear Pap test results.  Other health care providers may not recommend any screening for nonpregnant women who are considered low risk for pelvic cancer and who do not have symptoms. Ask your health care provider if a screening pelvic exam is right for you.  If you have had past treatment for cervical cancer or a condition that could lead to cancer, you need Pap tests and screening for cancer for at least 20  years after your treatment. If Pap tests have been discontinued, your risk factors (such as having a new sexual partner) need to be reassessed to determine if screening should resume. Some women have medical problems that increase the chance of getting cervical cancer. In these cases, your health care provider may recommend more frequent screening and Pap tests.  Colorectal Cancer  This type of cancer can be detected and often prevented.  Routine colorectal cancer screening usually begins at 54 years of age and continues through 54 years of age.  Your health care provider may recommend screening at an earlier age if you have risk factors for colon cancer.  Your health care provider may also recommend using home test kits to check for hidden blood in the stool.  A small camera at the end of a tube can be used to examine your colon directly (sigmoidoscopy or colonoscopy). This is done to check for the earliest forms of colorectal cancer.  Routine screening usually begins at age 62.  Direct examination of the colon should be repeated every 5-10 years through 54 years of age. However, you may need to be screened more often if early forms of precancerous polyps or small growths are found.  Skin Cancer  Check your skin from head to toe regularly.  Tell your health care provider about any new moles or changes in moles, especially if there is a change in a mole's shape or color.  Also tell your health care provider if you have a mole that is larger than the size of a pencil eraser.  Always use sunscreen. Apply sunscreen liberally and repeatedly throughout the day.  Protect yourself by wearing long sleeves, pants, a wide-brimmed hat, and sunglasses whenever you are outside.  Heart disease, diabetes, and high blood pressure  High blood pressure causes heart disease and increases the risk of stroke. High blood pressure is more likely to develop in: ? People who have blood pressure in the high  end of the normal range (130-139/85-89 mm Hg). ? People who are overweight or obese. ? People who are African American.  If you are 91-67 years of age, have your blood pressure checked every 3-5 years. If you are 14 years of age or older, have your blood pressure checked every year. You should have your blood pressure measured twice-once when you are at a hospital or clinic, and once when you are not at a hospital or clinic. Record the average  of the two measurements. To check your blood pressure when you are not at a hospital or clinic, you can use: ? An automated blood pressure machine at a pharmacy. ? A home blood pressure monitor.  If you are between 64 years and 49 years old, ask your health care provider if you should take aspirin to prevent strokes.  Have regular diabetes screenings. This involves taking a blood sample to check your fasting blood sugar level. ? If you are at a normal weight and have a low risk for diabetes, have this test once every three years after 54 years of age. ? If you are overweight and have a high risk for diabetes, consider being tested at a younger age or more often. Preventing infection Hepatitis B  If you have a higher risk for hepatitis B, you should be screened for this virus. You are considered at high risk for hepatitis B if: ? You were born in a country where hepatitis B is common. Ask your health care provider which countries are considered high risk. ? Your parents were born in a high-risk country, and you have not been immunized against hepatitis B (hepatitis B vaccine). ? You have HIV or AIDS. ? You use needles to inject street drugs. ? You live with someone who has hepatitis B. ? You have had sex with someone who has hepatitis B. ? You get hemodialysis treatment. ? You take certain medicines for conditions, including cancer, organ transplantation, and autoimmune conditions.  Hepatitis C  Blood testing is recommended for: ? Everyone born from  [birth year] through 1963. ? Anyone with known risk factors for hepatitis C.  Sexually transmitted infections (STIs)  You should be screened for sexually transmitted infections (STIs) including gonorrhea and chlamydia if: ? You are sexually active and are younger than 54 years of age. ? You are older than 54 years of age and your health care provider tells you that you are at risk for this type of infection. ? Your sexual activity has changed since you were last screened and you are at an increased risk for chlamydia or gonorrhea. Ask your health care provider if you are at risk.  If you do not have HIV, but are at risk, it may be recommended that you take a prescription medicine daily to prevent HIV infection. This is called pre-exposure prophylaxis (PrEP). You are considered at risk if: ? You are sexually active and do not regularly use condoms or know the HIV status of your partner(s). ? You take drugs by injection. ? You are sexually active with a partner who has HIV.  Talk with your health care provider about whether you are at high risk of being infected with HIV. If you choose to begin PrEP, you should first be tested for HIV. You should then be tested every 3 months for as long as you are taking PrEP. Pregnancy  If you are premenopausal and you may become pregnant, ask your health care provider about preconception counseling.  If you may become pregnant, take 400 to 800 micrograms (mcg) of folic acid every day.  If you want to prevent pregnancy, talk to your health care provider about birth control (contraception). Osteoporosis and menopause  Osteoporosis is a disease in which the bones lose minerals and strength with aging. This can result in serious bone fractures. Your risk for osteoporosis can be identified using a bone density scan.  If you are 45 years of age or older, or if you are at risk  for osteoporosis and fractures, ask your health care provider if you should be  screened.  Ask your health care provider whether you should take a calcium or vitamin D supplement to lower your risk for osteoporosis.  Menopause may have certain physical symptoms and risks.  Hormone replacement therapy may reduce some of these symptoms and risks. Talk to your health care provider about whether hormone replacement therapy is right for you. Follow these instructions at home:  Schedule regular health, dental, and eye exams.  Stay current with your immunizations.  Do not use any tobacco products including cigarettes, chewing tobacco, or electronic cigarettes.  If you are pregnant, do not drink alcohol.  If you are breastfeeding, limit how much and how often you drink alcohol.  Limit alcohol intake to no more than 1 drink per day for nonpregnant women. One drink equals 12 ounces of beer, 5 ounces of wine, or 1 ounces of hard liquor.  Do not use street drugs.  Do not share needles.  Ask your health care provider for help if you need support or information about quitting drugs.  Tell your health care provider if you often feel depressed.  Tell your health care provider if you have ever been abused or do not feel safe at home. This information is not intended to replace advice given to you by your health care provider. Make sure you discuss any questions you have with your health care provider. Document Released: 07/25/2010 Document Revised: 06/17/2015 Document Reviewed: 10/13/2014 Elsevier Interactive Patient Education  [employer].

## 2016-07-05 ENCOUNTER — Encounter: Payer: Self-pay | Admitting: *Deleted

## 2016-07-18 ENCOUNTER — Ambulatory Visit: Payer: Medicare Other | Admitting: Family Medicine

## 2016-09-11 IMAGING — DX DG SHOULDER 2+V*R*
2 series · 2 of 2 positions shown · non-contrast
Comparison: None.

CLINICAL DATA: Fall.  RIGHT shoulder pain.  Initial encounter.

EXAM:
RIGHT SHOULDER - 2+ VIEW

[shoulder grashey]
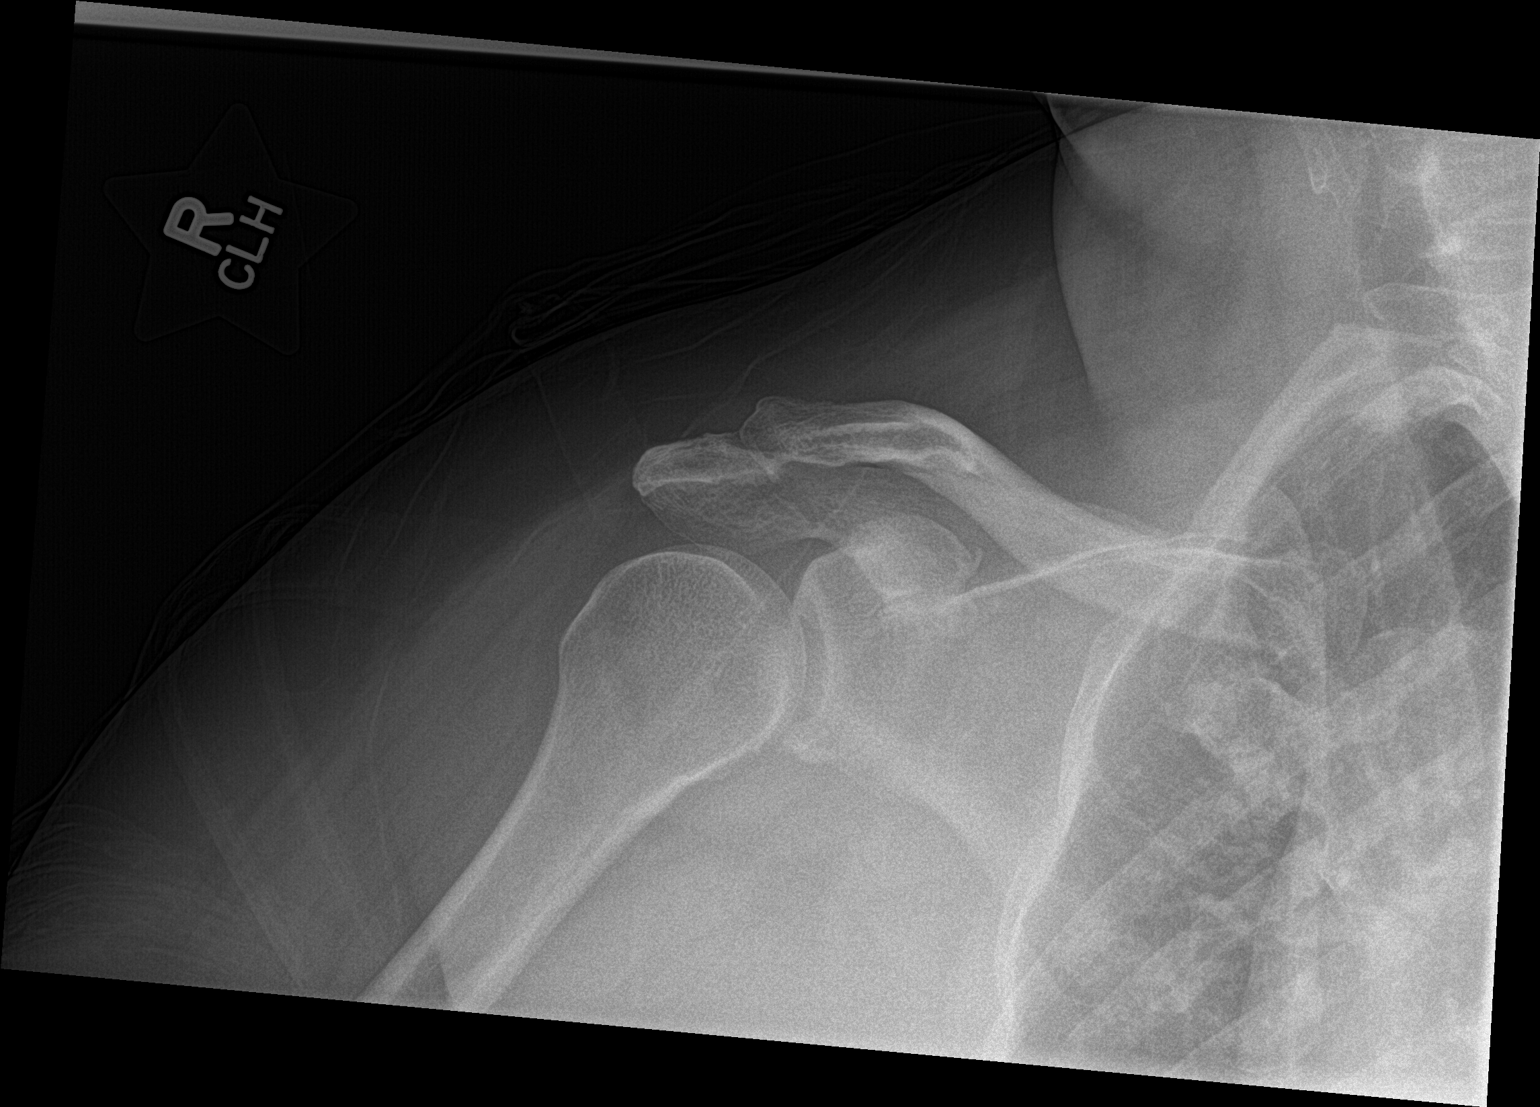

[shoulder y view]
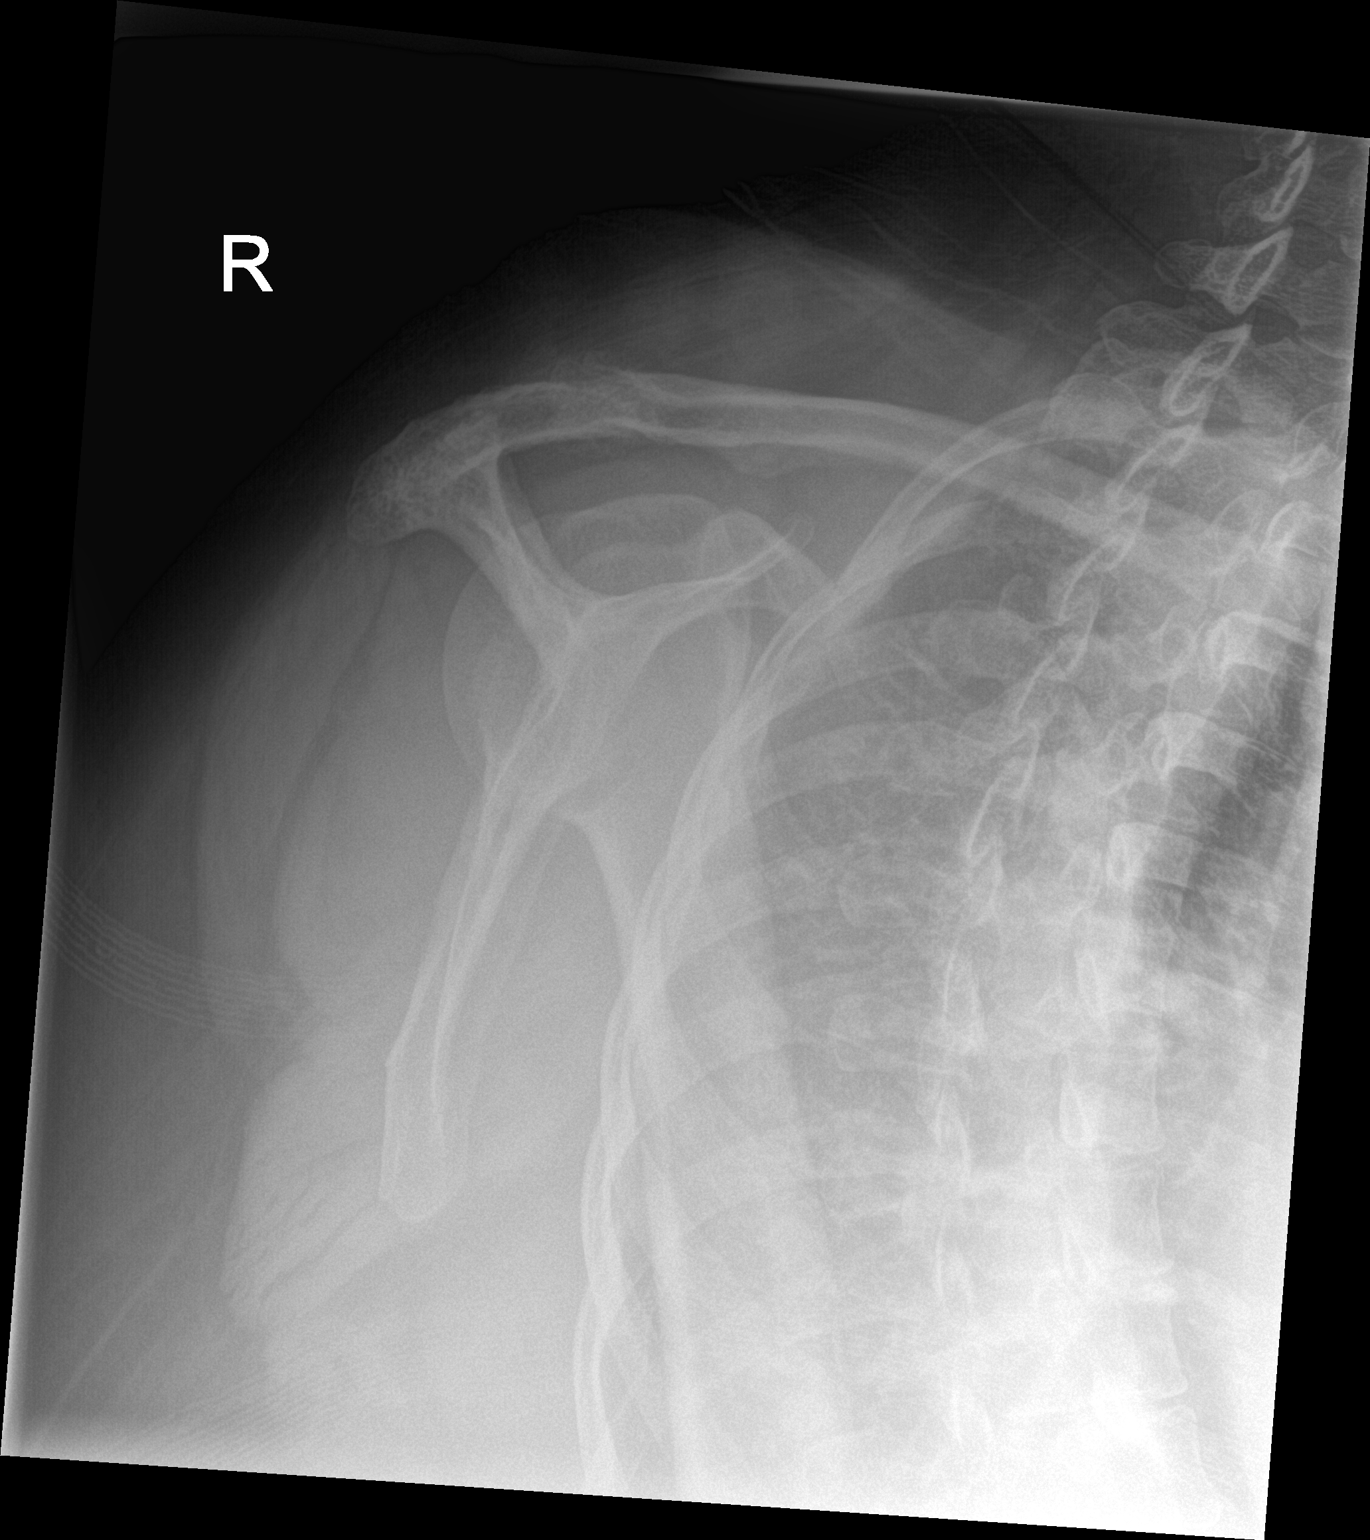

[2 of 2 positions shown; findings below may reference images not displayed]

FINDINGS: RIGHT glenohumeral joint is located. No axillary view is submitted
for interpretation. Mild AC joint osteoarthritis. Type 2 acromion.
Study degraded by obesity.
IMPRESSION: No acute abnormality.

## 2016-10-05 ENCOUNTER — Other Ambulatory Visit: Payer: Self-pay | Admitting: Family Medicine

## 2016-10-18 ENCOUNTER — Other Ambulatory Visit: Payer: Self-pay | Admitting: Family Medicine

## 2016-10-20 ENCOUNTER — Encounter: Payer: Self-pay | Admitting: Family Medicine

## 2016-10-20 ENCOUNTER — Ambulatory Visit (INDEPENDENT_AMBULATORY_CARE_PROVIDER_SITE_OTHER): Payer: Medicare Other | Admitting: Family Medicine

## 2016-10-20 ENCOUNTER — Encounter: Payer: Self-pay | Admitting: Psychology

## 2016-10-20 VITALS — BP 118/80 | HR 87 | Temp 97.9°F | Ht 67.5 in | Wt 321.0 lb

## 2016-10-20 DIAGNOSIS — E669 Obesity, unspecified: Secondary | ICD-10-CM | POA: Diagnosis not present

## 2016-10-20 DIAGNOSIS — E1169 Type 2 diabetes mellitus with other specified complication: Secondary | ICD-10-CM

## 2016-10-20 DIAGNOSIS — G250 Essential tremor: Secondary | ICD-10-CM | POA: Diagnosis not present

## 2016-10-20 DIAGNOSIS — E785 Hyperlipidemia, unspecified: Secondary | ICD-10-CM | POA: Diagnosis not present

## 2016-10-20 DIAGNOSIS — F411 Generalized anxiety disorder: Secondary | ICD-10-CM | POA: Diagnosis not present

## 2016-10-20 DIAGNOSIS — I10 Essential (primary) hypertension: Secondary | ICD-10-CM | POA: Diagnosis not present

## 2016-10-20 DIAGNOSIS — Z23 Encounter for immunization: Secondary | ICD-10-CM

## 2016-10-20 DIAGNOSIS — F3181 Bipolar II disorder: Secondary | ICD-10-CM

## 2016-10-20 LAB — POCT GLYCOSYLATED HEMOGLOBIN (HGB A1C): Hemoglobin A1C: 6.8

## 2016-10-20 MED ORDER — BUSPIRONE HCL 15 MG PO TABS: 15.0000 mg | ORAL_TABLET | Freq: Two times a day (BID) | ORAL | 1 refills | Status: AC

## 2016-10-20 MED ORDER — BUSPIRONE HCL 15 MG PO TABS: 15.0000 mg | ORAL_TABLET | Freq: Every day | ORAL | 1 refills | Status: DC

## 2016-10-20 NOTE — Assessment & Plan Note (Signed)
Diet and exercise counseling done. She did well losing weight since last visit. I commended her on this. Goal is to lose few more pounds till next visit. F/U in 3 months.

## 2016-10-20 NOTE — Assessment & Plan Note (Signed)
BP looks good, continue current regimen. 

## 2016-10-20 NOTE — Progress Notes (Signed)
Dr. Gwendlyn Deutscher requested [facility] consult for patient with bipolar 2 disorder and anxiety symptoms, which have been exacerbated.   Presenting Issue: Patient reported anxiety and depression.    Duration of CURRENT symptoms: She reported sweating, heart palpitations, trembling, worry related to driving, feeling sad and crying. She reported her anxiety and depressive symptoms have been exacerbated in the past 3 months. Notable stressors for her has been the death of her grandmother in Jan 17, 2016.    Age of onset of first mood disturbance: Patient was unable to provide an estimate but noted she had been struggling with bipolar and anxiety for years.   Impact on function: Patient reports social withdrawal, anhedonia, diffiuclty sleeping and loss of interest in intimacy with boyfriend. She has also begun to experience anxiety around driving and has been avoiding driving at times. Additionally, she has limited social support; her main social support, her grandmother passed away 01-17-16.  What have you tried so far: Patient has taken medication for years and has also received therapy from 1999 to 2015 in [facility].   Psychiatric History - Diagnoses: Bipolar 2 Disorder; anxiety  - Hospitalizations:  Yes, in 1998 connected to threats to hurt herself and others.  - Pharmacotherapy: Abilify  (30 mg). Used to take Xanax and found it helpful in reducing her anxiety but discontinued.    - Outpatient therapy: Yes from [place] to 2015  Family history of psychiatric issues: Mother and brother have bipolar diagnosis.   Current and history of substance use: Denied alcohol use and cigarettes. Reports using marijuana on weekends to manage pain.  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  Other: Chronic pain  PHQ-9: (24) but denied suicidal ideation per Dr. Gwendlyn Deutscher GAD7: (21) per Dr. Gwendlyn Deutscher.   Assessment/Plan Recommendations:  Patient's affect was low and she was tearful during the session  describing her depressive and anxiety symptoms and distress around recent avoidance of driving connected to her anxiety.  Per Dr. Leander Rams, her PHQ9 score is 24 and GAD7 score is 21 indicating severe depressive and anxiety symptomology; however, she denied suicidal ideation.   [facility] introduced patient to deep breathing as a way to manage anxiety. Additionally, [facility] discussed benefit of engaging in social and pleasurable activities and discussed with patient how she can do this. Patient set a goal of calling her sister this weekend. [facility] also discussed with patient referrals for psychiatrists and therapists in the area. Patient will call [facility] first, given her prior experience there. If she is unable to obtain appointment, [facility] provided patient with list of referrals in [place] that patient will call over the next week. [facility] will also call patient next Friday to check on her progress of connecting with resources.   Warm hand-off complete.

## 2016-10-20 NOTE — Assessment & Plan Note (Signed)
Compliant with her meds. I continue to recommend Psych assessment. She is been seen by our [facility] as of right now. List of psychiatrist in town was given for her to make her appointment.

## 2016-10-20 NOTE — Progress Notes (Signed)
Subjective:     Patient ID: Kristina Hendricks, female   DOB: 07-Apr-1960, 56 y.o.   MRN: [id number]  Diabetes  She presents for her follow-up diabetic visit. She has type 2 diabetes mellitus. Her disease course has been stable. Hypoglycemia symptoms include nervousness/anxiousness. Pertinent negatives for hypoglycemia include no confusion. Associated symptoms include polydipsia. Pertinent negatives for diabetes include no blurred vision, no chest pain, no foot paresthesias, no polyuria, no visual change, no weakness and no weight loss. Symptoms are stable. Current diabetic treatment includes oral agent (dual therapy) and insulin injections. She is compliant with treatment all of the time. Her weight is fluctuating minimally. She is following a generally healthy diet. She has not had a previous visit with a dietitian. She rarely participates in exercise. Home blood sugar record trend: She had glucose level of 80 2 weeks ago and she broke out into cold sweat. She had been okay since then. Otherwise her numbers ranges from 130-160. An ACE inhibitor/angiotensin II receptor blocker is being taken. She does not see a podiatrist.Eye exam is current.  Anxiety  Presents for follow-up visit. Symptoms include decreased concentration, depressed mood, excessive worry, irritability, nervous/anxious behavior, palpitations and panic. Patient reports no chest pain, confusion or suicidal ideas. Symptoms occur occasionally. Duration: 3 months. The severity of symptoms is moderate. The quality of sleep is poor. Nighttime awakenings: occasional.     OVZ:CHYIFOYDX with BP meds, no complaint. Tremors: C/O right hand shakes x 3 months. She feels it is related to her anxiety. At times she feels like she will drop something. She is afraid of driving because of this concern. Morbid Obesity: Patient is working on diet and exercise. She is here for follow-up.  Current Outpatient Prescriptions on File Prior to Visit  Medication Sig  Dispense Refill  . ADVAIR DISKUS 250-50 MCG/DOSE AEPB Inhale 1 puff into the lungs 2 (two) times daily. 60 each 3  . albuterol (PROVENTIL HFA;VENTOLIN HFA) 108 (90 Base) MCG/ACT inhaler Inhale 2 puffs into the lungs every 6 (six) hours as needed for wheezing or shortness of breath. 18 g 3  . ARIPiprazole (ABILIFY) 30 MG tablet TAKE 1 TABLET(30 MG) BY MOUTH DAILY 90 tablet 1  . aspirin EC 81 MG tablet Take 81 mg by mouth daily.    Marland Kitchen diltiazem (TIAZAC) 240 MG 24 hr capsule TAKE 1 CAPSULE BY MOUTH EVERY DAY 90 capsule 1  . LANTUS SOLOSTAR 100 UNIT/ML Solostar Pen ADMINISTER 26 UNITS UNDER THE SKIN AT BEDTIME 15 mL 3  . metFORMIN (GLUCOPHAGE-XR) 500 MG 24 hr tablet TAKE 1 TABLET(500 MG) BY MOUTH DAILY WITH BREAKFAST 90 tablet 1  . pravastatin (PRAVACHOL) 20 MG tablet TAKE 1 TABLET(20 MG) BY MOUTH DAILY 90 tablet 1  . sitaGLIPtin (JANUVIA) 100 MG tablet Take 1 tablet (100 mg total) by mouth daily. 90 tablet 1  . telmisartan-hydrochlorothiazide (MICARDIS HCT) 80-25 MG tablet TAKE 1 TABLET BY MOUTH DAILY 90 tablet 1  . B-D ULTRAFINE III SHORT PEN 31G X 8 MM MISC USE AS DIRECTED EVERY DAY 100 each 6  . busPIRone (BUSPAR) 7.5 MG tablet TAKE 1 TABLET(7.5 MG) BY MOUTH TWICE DAILY (Patient not taking: Reported on 07/04/2016) 180 tablet 1  . ibuprofen (ADVIL,MOTRIN) 800 MG tablet Take 800 mg by mouth every 8 (eight) hours as needed for headache or moderate pain. Reported on 06/29/2015    . mometasone (NASONEX) 50 MCG/ACT nasal spray Place 2 sprays into the nose daily. (Patient not taking: Reported on 10/20/2016) 17 g 5  .  ONE TOUCH ULTRA TEST test strip USE TO CHECK BLOOD SUGAR THREE TIMES DAILY OR AS DIRECTED 100 each 6  . ranitidine (ZANTAC) 150 MG capsule Take 1 capsule (150 mg total) by mouth 2 (two) times daily. (Patient not taking: Reported on 10/20/2016) 60 capsule 3   No current facility-administered medications on file prior to visit.    Past Medical History:  Diagnosis Date  . Arthritis   . Asthma    . COPD (chronic obstructive pulmonary disease) ([place])   . Diabetes mellitus without complication ([place])   . Hyperlipidemia   . Hypertension   . Sleep apnea    Vitals:   10/20/16 0906  BP: 118/80  Pulse: 87  Temp: 97.9 F (36.6 C)  TempSrc: Oral  SpO2: 97%  Weight: (!) 321 lb (145.6 kg)  Height: 5' 7.5" (1.715 m)      Review of Systems  Constitutional: Positive for irritability. Negative for weight loss.  Eyes: Negative for blurred vision.  Respiratory: Negative.   Cardiovascular: Positive for palpitations. Negative for chest pain.  Gastrointestinal: Negative.   Endocrine: Positive for polydipsia. Negative for polyuria.  Musculoskeletal: Negative.   Neurological: Negative for weakness.  Psychiatric/Behavioral: Positive for decreased concentration and sleep disturbance. Negative for behavioral problems, confusion and suicidal ideas. The patient is nervous/anxious.   All other systems reviewed and are negative.      Objective:   Physical Exam  Constitutional: She is oriented to person, place, and time. She appears well-developed. No distress.  Cardiovascular: Normal rate, regular rhythm, normal heart sounds and intact distal pulses.   No murmur heard. Pulmonary/Chest: Effort normal and breath sounds normal. No respiratory distress. She has no wheezes.  Abdominal: Soft. Bowel sounds are normal. She exhibits no mass. There is no tenderness.  Musculoskeletal: Normal range of motion. She exhibits no edema.  Neurological: She is alert and oriented to person, place, and time. She has normal reflexes. No cranial nerve deficit.  Mild tremors of her right hand at rest and with movement.  Psychiatric: She has a normal mood and affect. Her behavior is normal. Judgment and thought content normal.  Nursing note and vitals reviewed.      Assessment:     DM2 Anxiety/Bipolar HTN Morbid obesity Tremors    Plan:     Check problem list.

## 2016-10-20 NOTE — Assessment & Plan Note (Signed)
Seems to be doing well on current regimen. She had one episode of hypoglycemia to the 80s. I recommended snacking in between meal and drinking orange juice with low numbers. For now, I will continue current regimen. F/U in 3 months.

## 2016-10-20 NOTE — Assessment & Plan Note (Signed)
Patient reassured this is benign and less likely related to her anxiety although anxiety can make it worsen. We will manage conservatively. Avoid triggers. Reduce coffee. Consider pharmacologic agents in the future.

## 2016-10-20 NOTE — Assessment & Plan Note (Addendum)
GAD 7 score of 21 and PHQ9 score of 24. Anxiety with comorbid depression and Bipolar. [facility] counseling done today. As discussed with her, she need to be managed by a psychiatrist given her comorbid psych issues. I increased her Buspar to 15 mg BID from 7.5 mg BID. F/U in 2 weeks for reassessment. [facility] will contact her next week for follow-up as well.

## 2016-10-20 NOTE — Patient Instructions (Signed)

## 2016-10-21 LAB — BASIC METABOLIC PANEL
BUN / CREAT RATIO: 15 (ref 9–23)
BUN: 11 mg/dL (ref 6–24)
CO2: 25 mmol/L (ref 20–29)
CREATININE: 0.74 mg/dL (ref 0.57–1.00)
Calcium: 10 mg/dL (ref 8.7–10.2)
Chloride: 100 mmol/L (ref 96–106)
GFR, EST AFRICAN AMERICAN: 107 mL/min/{1.73_m2} (ref >59)
GFR, EST NON AFRICAN AMERICAN: 93 mL/min/{1.73_m2} (ref >59)
GLUCOSE: 119 mg/dL — AB (ref 65–99)
Potassium: 3.7 mmol/L (ref 3.5–5.2)
SODIUM: 140 mmol/L (ref 134–144)

## 2016-10-21 LAB — LIPID PANEL
CHOLESTEROL TOTAL: 137 mg/dL (ref 100–199)
Chol/HDL Ratio: 3.8 ratio (ref 0.0–4.4)
HDL: 36 mg/dL — AB (ref >39)
LDL Calculated: 79 mg/dL (ref 0–99)
TRIGLYCERIDES: 112 mg/dL (ref 0–149)
VLDL CHOLESTEROL CAL: 22 mg/dL (ref 5–40)

## 2016-10-23 ENCOUNTER — Encounter: Payer: Self-pay | Admitting: *Deleted

## 2016-10-23 ENCOUNTER — Other Ambulatory Visit: Payer: Self-pay | Admitting: Family Medicine

## 2016-10-27 ENCOUNTER — Telehealth: Payer: Self-pay | Admitting: Psychology

## 2016-10-27 NOTE — Telephone Encounter (Signed)
[place] called Kristina Hendricks to discuss progress on connecting with resources as discussed last Friday (10/20/16) during warm hand-off appointment. [facility] was unable to speak with [name] and left a voicemail requesting a call back.

## 2016-11-23 DEATH — deceased

## 2018-02-27 ENCOUNTER — Telehealth: Payer: Self-pay | Admitting: *Deleted

## 2018-02-27 NOTE — Telephone Encounter (Signed)
Tried to contact patient twice but number is no longer is service.  Will mail a letter asking her to contact us with her new information and make an appointment. Ronne Stefanski,CMA

## 2018-02-27 NOTE — Telephone Encounter (Signed)
-----   Message from Kinnie Feil, MD sent at 02/26/2018  8:03 AM EST ----- Please help patient schedule f/u with me. Thanks

## 2018-02-27 NOTE — Telephone Encounter (Signed)
Thanks

## 2018-03-14 NOTE — Telephone Encounter (Signed)
Letter was returned stating that there wasn't anyone at this address with that name.  Kristina Hendricks,CMA

## 2022-09-18 ENCOUNTER — Encounter: Payer: Self-pay | Admitting: Family Medicine
